# Patient Record
Sex: Male | Born: 1975 | Race: Black or African American | Hispanic: No | Smoking: Never smoker
Health system: Southern US, Community
[De-identification: ages and names within clinical notes are randomized; demographics above are authoritative.]

## PROBLEM LIST (undated history)

## (undated) DIAGNOSIS — Z21 Asymptomatic human immunodeficiency virus [HIV] infection status: Secondary | ICD-10-CM

## (undated) DIAGNOSIS — I10 Essential (primary) hypertension: Secondary | ICD-10-CM

## (undated) DIAGNOSIS — E0590 Thyrotoxicosis, unspecified without thyrotoxic crisis or storm: Secondary | ICD-10-CM

## (undated) DIAGNOSIS — B20 Human immunodeficiency virus [HIV] disease: Secondary | ICD-10-CM

---

## 2008-07-31 ENCOUNTER — Emergency Department (HOSPITAL_COMMUNITY): Admission: EM | Admit: 2008-07-31 | Discharge: 2008-07-31 | Payer: Self-pay | Admitting: Emergency Medicine

## 2008-12-30 ENCOUNTER — Encounter: Payer: Self-pay | Admitting: Emergency Medicine

## 2008-12-31 ENCOUNTER — Ambulatory Visit: Payer: Self-pay | Admitting: Cardiovascular Disease

## 2008-12-31 ENCOUNTER — Observation Stay (HOSPITAL_COMMUNITY): Admission: EM | Admit: 2008-12-31 | Discharge: 2008-12-31 | Payer: Self-pay | Admitting: Internal Medicine

## 2008-12-31 ENCOUNTER — Encounter (INDEPENDENT_AMBULATORY_CARE_PROVIDER_SITE_OTHER): Payer: Self-pay | Admitting: Internal Medicine

## 2009-01-13 DIAGNOSIS — R0789 Other chest pain: Secondary | ICD-10-CM | POA: Insufficient documentation

## 2009-01-13 DIAGNOSIS — I10 Essential (primary) hypertension: Secondary | ICD-10-CM | POA: Insufficient documentation

## 2009-01-13 DIAGNOSIS — E0590 Thyrotoxicosis, unspecified without thyrotoxic crisis or storm: Secondary | ICD-10-CM | POA: Insufficient documentation

## 2009-01-18 ENCOUNTER — Ambulatory Visit: Payer: Self-pay | Admitting: Cardiovascular Disease

## 2009-05-05 ENCOUNTER — Emergency Department (HOSPITAL_COMMUNITY): Admission: EM | Admit: 2009-05-05 | Discharge: 2009-05-05 | Payer: Self-pay | Admitting: Emergency Medicine

## 2010-05-09 LAB — DIFFERENTIAL
Basophils Absolute: 0 10*3/uL (ref 0.0–0.1)
Basophils Relative: 0 % (ref 0–1)
Eosinophils Absolute: 0 10*3/uL (ref 0.0–0.7)
Lymphocytes Relative: 14 % (ref 12–46)
Monocytes Absolute: 1 10*3/uL (ref 0.1–1.0)
Monocytes Relative: 19 % — ABNORMAL HIGH (ref 3–12)
Neutro Abs: 3.6 10*3/uL (ref 1.7–7.7)
Neutrophils Relative %: 66 % (ref 43–77)

## 2010-05-09 LAB — CBC
HCT: 37.5 % — ABNORMAL LOW (ref 39.0–52.0)
RBC: 4.83 MIL/uL (ref 4.22–5.81)

## 2010-05-18 LAB — CARDIAC PANEL(CRET KIN+CKTOT+MB+TROPI)
CK, MB: 0.6 ng/mL (ref 0.3–4.0)
Relative Index: INVALID (ref 0.0–2.5)
Total CK: 52 U/L (ref 7–232)

## 2010-05-18 LAB — CBC
MCHC: 31.5 g/dL (ref 30.0–36.0)
Platelets: 170 10*3/uL (ref 150–400)
RBC: 4.66 MIL/uL (ref 4.22–5.81)

## 2010-05-18 LAB — POCT I-STAT, CHEM 8
BUN: 5 mg/dL — ABNORMAL LOW (ref 6–23)
Calcium, Ion: 1.16 mmol/L (ref 1.12–1.32)
Creatinine, Ser: 0.7 mg/dL (ref 0.4–1.5)
Glucose, Bld: 103 mg/dL — ABNORMAL HIGH (ref 70–99)
HCT: 39 % (ref 39.0–52.0)
Hemoglobin: 13.3 g/dL (ref 13.0–17.0)
Potassium: 3.7 mEq/L (ref 3.5–5.1)
Sodium: 144 mEq/L (ref 135–145)

## 2010-05-18 LAB — CK TOTAL AND CKMB (NOT AT ARMC): Relative Index: INVALID (ref 0.0–2.5)

## 2010-05-18 LAB — PROTIME-INR: Prothrombin Time: 13.7 seconds (ref 11.6–15.2)

## 2010-05-18 LAB — RAPID URINE DRUG SCREEN, HOSP PERFORMED
Amphetamines: NOT DETECTED
Benzodiazepines: NOT DETECTED
Opiates: NOT DETECTED
Tetrahydrocannabinol: NOT DETECTED

## 2010-05-18 LAB — T4, FREE: Free T4: 4.12 ng/dL — ABNORMAL HIGH (ref 0.80–1.80)

## 2010-05-18 LAB — LIPID PANEL
HDL: 44 mg/dL (ref >39)
VLDL: 8 mg/dL (ref 0–40)

## 2010-05-18 LAB — BASIC METABOLIC PANEL
CO2: 25 mEq/L (ref 19–32)
Creatinine, Ser: 0.57 mg/dL (ref 0.4–1.5)
GFR calc Af Amer: >60 mL/min (ref >60)
GFR calc non Af Amer: >60 mL/min (ref >60)
Glucose, Bld: 89 mg/dL (ref 70–99)
Sodium: 141 mEq/L (ref 135–145)

## 2010-05-18 LAB — APTT: aPTT: 32 seconds (ref 24–37)

## 2011-05-10 ENCOUNTER — Encounter (HOSPITAL_COMMUNITY): Payer: Self-pay | Admitting: Emergency Medicine

## 2011-05-10 ENCOUNTER — Other Ambulatory Visit (HOSPITAL_COMMUNITY): Payer: Self-pay | Admitting: Pharmacy Technician

## 2011-05-10 ENCOUNTER — Emergency Department (HOSPITAL_COMMUNITY): Payer: Self-pay

## 2011-05-10 ENCOUNTER — Other Ambulatory Visit: Payer: Self-pay

## 2011-05-10 ENCOUNTER — Emergency Department (HOSPITAL_COMMUNITY)
Admission: EM | Admit: 2011-05-10 | Discharge: 2011-05-11 | Disposition: A | Discharge status: Home / Self Care | Payer: Self-pay | Attending: Emergency Medicine | Admitting: Emergency Medicine

## 2011-05-10 DIAGNOSIS — Z79899 Other long term (current) drug therapy: Secondary | ICD-10-CM | POA: Insufficient documentation

## 2011-05-10 DIAGNOSIS — R05 Cough: Secondary | ICD-10-CM | POA: Insufficient documentation

## 2011-05-10 DIAGNOSIS — Z21 Asymptomatic human immunodeficiency virus [HIV] infection status: Secondary | ICD-10-CM | POA: Insufficient documentation

## 2011-05-10 DIAGNOSIS — J40 Bronchitis, not specified as acute or chronic: Secondary | ICD-10-CM | POA: Insufficient documentation

## 2011-05-10 DIAGNOSIS — R059 Cough, unspecified: Secondary | ICD-10-CM | POA: Insufficient documentation

## 2011-05-10 DIAGNOSIS — R0602 Shortness of breath: Secondary | ICD-10-CM | POA: Insufficient documentation

## 2011-05-10 DIAGNOSIS — R079 Chest pain, unspecified: Secondary | ICD-10-CM | POA: Insufficient documentation

## 2011-05-10 HISTORY — DX: Asymptomatic human immunodeficiency virus (hiv) infection status: Z21

## 2011-05-10 HISTORY — DX: Essential (primary) hypertension: I10

## 2011-05-10 HISTORY — DX: Thyrotoxicosis, unspecified without thyrotoxic crisis or storm: E05.90

## 2011-05-10 HISTORY — DX: Human immunodeficiency virus (HIV) disease: B20

## 2011-05-10 LAB — POCT I-STAT TROPONIN I: Troponin i, poc: 0 ng/mL (ref 0.00–0.08)

## 2011-05-10 MED ORDER — ALBUTEROL SULFATE (5 MG/ML) 0.5% IN NEBU
5.0000 mg | INHALATION_SOLUTION | Freq: Once | RESPIRATORY_TRACT | Status: AC
  Administered 2011-05-10: 5 mg via RESPIRATORY_TRACT
  Filled 2011-05-10: qty 1

## 2011-05-10 MED ORDER — SODIUM CHLORIDE 0.9 % IV BOLUS (SEPSIS)
1000.0000 mL | Freq: Once | INTRAVENOUS | Status: AC
  Administered 2011-05-10: 1000 mL via INTRAVENOUS

## 2011-05-10 NOTE — ED Notes (Signed)
Pt states he has been having chest pain for the past month or so  Pt states the pain is worse with breathing

## 2011-05-10 NOTE — ED Provider Notes (Signed)
History     CSN: [id number]  Arrival date & time 05/10/11  2144   First MD Initiated Contact with Patient 05/10/11 2240      Chief Complaint  Patient presents with  . Chest Pain    (Consider location/radiation/quality/duration/timing/severity/associated sxs/prior treatment) HPI Comments: Patient with HIV comes with a month history of substernal chest pain - states the pain is the same all the time though worsening with deep inspiration but particularly with cough - states that he is having productive cough with yellow sputum, like the last time he had pneumonia - states that he has been off his HIV medications for the past year because he could not afford to get them.  Since then, he has moved from [place] to here and is searching for a new MD so that he can get the medication.  Before that time he reports that his CD4 count was still low at 300 and his viral load was high at 40,000.  He states no fever or chills, but report night sweats and weight loss.  Denies hemoptysis, radiation of the pain, back pain, nausea, vomiting or diarrhea.  Patient is a 36 y.o. male presenting with chest pain. The history is provided by the patient. No language interpreter was used.  Chest Pain The chest pain began more  than 1 month ago. Chest pain occurs constantly. The chest pain is worsening. The pain is associated with breathing and coughing. At its most intense, the pain is at 6/10. The pain is currently at 6/10. The severity of the pain is moderate. The quality of the pain is described as dull. The pain does not radiate. Chest pain is worsened by deep breathing. Primary symptoms include shortness of breath and cough. Pertinent negatives for primary symptoms include no fever, no fatigue, no syncope, no wheezing, no palpitations, no abdominal pain, no nausea, no vomiting, no dizziness and no altered mental status.  Pertinent negatives for associated symptoms include no claudication, no diaphoresis, no lower  extremity edema, no near-syncope, no numbness, no orthopnea, no paroxysmal nocturnal dyspnea and no weakness. He tried nothing for the symptoms.  His past medical history is significant for hypertension.     Past Medical History  Diagnosis Date  . Hyperthyroidism   . HIV (human immunodeficiency virus infection)   . Hypertension     History reviewed. No pertinent past surgical history.  Family History  Problem Relation Age of Onset  . Hypertension Other   . Cancer Other     History  Substance Use Topics  . Smoking status: Never Smoker   . Smokeless tobacco: Not on file  . Alcohol Use: Yes     moderate       Review of Systems  Constitutional: Negative for fever, diaphoresis and fatigue.  Respiratory: Positive for cough and shortness of breath. Negative for wheezing.   Cardiovascular: Positive for chest pain. Negative for palpitations, orthopnea, claudication, syncope and near-syncope.  Gastrointestinal: Negative for nausea, vomiting and abdominal pain.  Neurological: Negative for dizziness, weakness and numbness.  Psychiatric/Behavioral: Negative for altered mental status.  All other systems reviewed and are negative.    Allergies  Sulfamethoxazole w/trimethoprim  Home Medications   Current Outpatient Rx  Name Route Sig Dispense Refill  . ATAZANAVIR SULFATE 150 MG PO CAPS Oral Take 150 mg by mouth daily with breakfast.    . EMTRICITABINE-TENOFOVIR 200-300 MG PO TABS Oral Take 1 tablet by mouth daily.    Marland Kitchen METOPROLOL TARTRATE 25 MG PO TABS Oral  Take 25 mg by mouth 2 (two) times daily.    . ADULT MULTIVITAMIN W/MINERALS CH Oral Take 1 tablet by mouth daily.    Marland Kitchen RITONAVIR 100 MG PO CAPS Oral Take 200 mg by mouth 2 (two) times daily.      BP 149/96  Pulse 68  Temp(Src) 98.5 F (36.9 C) (Oral)  Resp 17  SpO2 100%  Physical Exam  Nursing note and vitals reviewed. Constitutional: He is oriented to person, place, and time. He appears well-developed and  well-nourished. No distress.  HENT:  Head: Normocephalic and atraumatic.  Right Ear: External ear normal.  Left Ear: External ear normal.  Nose: Nose normal.  Mouth/Throat: Oropharynx is clear and moist. No oropharyngeal exudate.  Eyes: Conjunctivae are normal. Pupils are equal, round, and reactive to light. No scleral icterus.  Neck: Normal range of motion. Neck supple.  Cardiovascular: Normal rate, regular rhythm, normal heart sounds and intact distal pulses.  Exam reveals no gallop and no friction rub.   No murmur heard. Pulmonary/Chest: Effort normal and breath sounds normal. No respiratory distress. He has no wheezes. He has no rales. He exhibits no tenderness.       Coughs with deep inspiration  Abdominal: Soft. Bowel sounds are normal. He exhibits no distension. There is no tenderness.  Musculoskeletal: Normal range of motion. He exhibits no edema and no tenderness.  Lymphadenopathy:    He has no cervical adenopathy.  Neurological: He is alert and oriented to person, place, and time. No cranial nerve deficit.  Skin: Skin is warm and dry. No rash noted. No erythema. No pallor.  Psychiatric: He has a normal mood and affect. His behavior is normal. Judgment and thought content normal.    ED Course  Procedures (including critical care time)   Labs Reviewed  POCT I-STAT TROPONIN I  CBC  DIFFERENTIAL  BASIC METABOLIC PANEL  D-DIMER, QUANTITATIVE   No results found.  Results for orders placed during the hospital encounter of 05/10/11  CBC      Component Value Range   WBC 4.3  4.0 - 10.5 (K/uL)   RBC 5.04  4.22 - 5.81 (MIL/uL)   Hemoglobin 12.0 (*) 13.0 - 17.0 (g/dL)   HCT 16.1 (*) 09.6 - 52.0 (%)   MCV 73.6 (*) 78.0 - 100.0 (fL)   MCH 23.8 (*) 26.0 - 34.0 (pg)   MCHC 32.3  30.0 - 36.0 (g/dL)   RDW 04.5  40.9 - 81.1 (%)   Platelets 128 (*) 150 - 400 (K/uL)  DIFFERENTIAL      Component Value Range   Neutrophils Relative 48  43 - 77 (%)   Lymphocytes Relative 33  12 -  46 (%)   Monocytes Relative 17 (*) 3 - 12 (%)   Eosinophils Relative 1  0 - 5 (%)   Basophils Relative 1  0 - 1 (%)   Neutro Abs 2.2  1.7 - 7.7 (K/uL)   Lymphs Abs 1.4  0.7 - 4.0 (K/uL)   Monocytes Absolute 0.7  0.1 - 1.0 (K/uL)   Eosinophils Absolute 0.0  0.0 - 0.7 (K/uL)   Basophils Absolute 0.0  0.0 - 0.1 (K/uL)   RBC Morphology ELLIPTOCYTES     WBC Morphology TOXIC GRANULATION     Smear Review LARGE PLATELETS PRESENT    POCT I-STAT TROPONIN I      Component Value Range   Troponin i, poc 0.00  0.00 - 0.08 (ng/mL)   Comment 3  D-DIMER, QUANTITATIVE      Component Value Range   D-Dimer, Quant 0.36  0.00 - 0.48 (ug/mL-FEU)   Dg Chest 2 View  05/11/2011  *RADIOLOGY REPORT*  Clinical Data: 36 year old male with chest pain.  CHEST - 2 VIEW  Comparison: 05/05/2009  Findings: The cardiomediastinal silhouette is unremarkable. The lungs are clear. There is no evidence of focal airspace disease, pulmonary edema, suspicious pulmonary nodule/mass, pleural effusion, or pneumothorax. No acute bony abnormalities are identified.  IMPRESSION: No evidence of acute cardiopulmonary disease.  Original Report Authenticated By: Rosendo Gros, M.D.    Date: 05/11/2011  Rate: 66  Rhythm: normal sinus rhythm  QRS Axis: normal  Intervals: normal  ST/T Wave abnormalities: early repolarization  Conduction Disutrbances:none  Narrative Interpretation: LVH - no real change from before - Reviewed by Dr. Nicanor Alcon  Old EKG Reviewed: changes noted    Bronchitis    MDM  Patient with HIV who has not been on his anti-virals for the past year presents with a three month history of cough and chest pain.  Though there is no consolidation on x-ray, I plan to treat the patient for likely bronchitis - he reports improvement in respiratory symptoms after the albuterol so I will give him an inhaler here and start him on azithromycin.  He now has insurance and would like to be referred to ID so I will do this as  well as referral to [facility].        Izola [place], [place] 05/11/11 [phone]

## 2011-05-10 NOTE — ED Notes (Signed)
patient has not been able to take retroviral regiment for over a year due to funds

## 2011-05-11 ENCOUNTER — Telehealth: Payer: Self-pay | Admitting: *Deleted

## 2011-05-11 LAB — DIFFERENTIAL
Basophils Relative: 1 % (ref 0–1)
Lymphocytes Relative: 33 % (ref 12–46)
Lymphs Abs: 1.4 10*3/uL (ref 0.7–4.0)
Monocytes Absolute: 0.7 10*3/uL (ref 0.1–1.0)
Neutro Abs: 2.2 10*3/uL (ref 1.7–7.7)

## 2011-05-11 LAB — BASIC METABOLIC PANEL
BUN: 15 mg/dL (ref 6–23)
CO2: 26 mEq/L (ref 19–32)
Chloride: 99 mEq/L (ref 96–112)
Creatinine, Ser: 0.94 mg/dL (ref 0.50–1.35)
GFR calc Af Amer: >90 mL/min (ref >90)
GFR calc non Af Amer: >90 mL/min (ref >90)
Glucose, Bld: 93 mg/dL (ref 70–99)
Potassium: 4 mEq/L (ref 3.5–5.1)

## 2011-05-11 LAB — D-DIMER, QUANTITATIVE: D-Dimer, Quant: 0.36 ug/mL-FEU (ref 0.00–0.48)

## 2011-05-11 LAB — CBC
MCHC: 32.3 g/dL (ref 30.0–36.0)
RBC: 5.04 MIL/uL (ref 4.22–5.81)
WBC: 4.3 10*3/uL (ref 4.0–10.5)

## 2011-05-11 MED ORDER — AZITHROMYCIN 250 MG PO TABS
500.0000 mg | ORAL_TABLET | Freq: Once | ORAL | Status: AC
  Administered 2011-05-11: 500 mg via ORAL
  Filled 2011-05-11: qty 2

## 2011-05-11 MED ORDER — HYDROCOD POLST-CHLORPHEN POLST 10-8 MG/5ML PO LQCR: 5.0000 mL | Freq: Two times a day (BID) | ORAL | Status: DC

## 2011-05-11 MED ORDER — ALBUTEROL SULFATE HFA 108 (90 BASE) MCG/ACT IN AERS
2.0000 | INHALATION_SPRAY | Freq: Once | RESPIRATORY_TRACT | Status: AC
  Administered 2011-05-11: 2 via RESPIRATORY_TRACT
  Filled 2011-05-11: qty 6.7

## 2011-05-11 MED ORDER — AZITHROMYCIN 250 MG PO TABS: ORAL_TABLET | ORAL | Status: AC

## 2011-05-11 NOTE — ED Provider Notes (Signed)
Medical screening examination/treatment/procedure(s) were performed by non-physician practitioner and as supervising physician I was immediately available for consultation/collaboration.  Egan Sahlin K Sherryn Pollino-Rasch, MD 05/11/11 0301 

## 2011-05-11 NOTE — Telephone Encounter (Signed)
Patient went to ED yesterday, he is HIV+ and has been out of care for one year. He relocated from [place], [place].  Was told at ED to call this office for appointment. Patient will need an intake and records have to be obtained from previous clinic. Patient left message with our new patient coordinator Tomasita Morrow, and she will return patient's call. Wendall Mola CMA

## 2011-05-11 NOTE — Discharge Instructions (Signed)

## 2011-08-17 ENCOUNTER — Encounter (HOSPITAL_COMMUNITY): Payer: Self-pay | Admitting: *Deleted

## 2011-08-17 ENCOUNTER — Emergency Department (HOSPITAL_COMMUNITY)
Admission: EM | Admit: 2011-08-17 | Discharge: 2011-08-17 | Disposition: A | Discharge status: Home / Self Care | Payer: Self-pay | Attending: Emergency Medicine | Admitting: Emergency Medicine

## 2011-08-17 DIAGNOSIS — Z21 Asymptomatic human immunodeficiency virus [HIV] infection status: Secondary | ICD-10-CM | POA: Insufficient documentation

## 2011-08-17 DIAGNOSIS — I10 Essential (primary) hypertension: Secondary | ICD-10-CM | POA: Insufficient documentation

## 2011-08-17 DIAGNOSIS — N342 Other urethritis: Secondary | ICD-10-CM

## 2011-08-17 DIAGNOSIS — R369 Urethral discharge, unspecified: Secondary | ICD-10-CM | POA: Insufficient documentation

## 2011-08-17 MED ORDER — LIDOCAINE HCL 1 % IJ SOLN
INTRAMUSCULAR | Status: AC
  Filled 2011-08-17: qty 20

## 2011-08-17 MED ORDER — CEFTRIAXONE SODIUM 250 MG IJ SOLR
250.0000 mg | Freq: Once | INTRAMUSCULAR | Status: AC
  Administered 2011-08-17: 250 mg via INTRAMUSCULAR
  Filled 2011-08-17 (×2): qty 250

## 2011-08-17 MED ORDER — AZITHROMYCIN 250 MG PO TABS
1000.0000 mg | ORAL_TABLET | Freq: Once | ORAL | Status: AC
  Administered 2011-08-17: 1000 mg via ORAL
  Filled 2011-08-17: qty 4

## 2011-08-17 NOTE — ED Provider Notes (Signed)
History     CSN: [id number]  Arrival date & time 08/17/11  2121   First MD Initiated Contact with Patient 08/17/11 2145      Chief Complaint  Patient presents with  . Abdominal Pain    (Consider location/radiation/quality/duration/timing/severity/associated sxs/prior treatment) HPI Patient presents emergency Dept. the 10 day history of penile discharge.  He states that he has 2 kn in his groin on each side that are painful at times.  The patient denies, abdominal pain, nausea, vomiting, or diarrhea.  Patient states he is not having pain with urination.  The patient states he is HIV positive as well. Past Medical History  Diagnosis Date  . Hyperthyroidism   . HIV (human immunodeficiency virus infection)   . Hypertension     History reviewed. No pertinent past surgical history.  Family History  Problem Relation Age of Onset  . Hypertension Other   . Cancer Other     History  Substance Use Topics  . Smoking status: Never Smoker   . Smokeless tobacco: Not on file  . Alcohol Use: Yes     moderate       Review of Systems All other systems negative except as documented in the HPI. All pertinent positives and negatives as reviewed in the HPI.  Allergies  Sulfamethoxazole w-trimethoprim  Home Medications   Current Outpatient Rx  Name Route Sig Dispense Refill  . ATAZANAVIR SULFATE 300 MG PO CAPS Oral Take 300 mg by mouth daily with breakfast.    . EMTRICITABINE-TENOFOVIR 200-300 MG PO TABS Oral Take 1 tablet by mouth daily.    Marland Kitchen METHIMAZOLE 5 MG PO TABS Oral Take 5 mg by mouth 3 (three) times daily.    Marland Kitchen RITONAVIR 100 MG PO CAPS Oral Take 100 mg by mouth daily.       BP 119/87  Pulse 107  Temp 99.2 F (37.3 C) (Oral)  Resp 16  SpO2 98%  Physical Exam  Nursing note and vitals reviewed. Constitutional: He appears well-developed and well-nourished. No distress.  Cardiovascular: Normal rate and regular rhythm.   Pulmonary/Chest: Effort normal and breath sounds  normal.  Genitourinary:    Discharge found.  Skin: Skin is warm and dry.    ED Course  Procedures (including critical care time)  Patient has urethral discharge and will be treated for this.  He is referred back to his primary care Dr. for further evaluation and recheck.  I advised him to followup with his doctor also, about this reddened area  MDM          Carlyle Dolly, PA-C 08/17/11 2225

## 2011-08-17 NOTE — ED Provider Notes (Signed)
Medical screening examination/treatment/procedure(s) were performed by non-physician practitioner and as supervising physician I was immediately available for consultation/collaboration.   Gwyneth Sprout, MD 08/17/11 2245

## 2011-08-17 NOTE — ED Notes (Signed)
Per pt c/o penile discharge; swelling x 10 days. Painful knots noted bilateral groin x 1 month

## 2011-08-18 LAB — RPR: RPR Ser Ql: REACTIVE — AB

## 2011-08-18 LAB — RPR TITER: RPR Titer: 1:64 {titer} — AB

## 2011-08-21 LAB — GC/CHLAMYDIA PROBE AMP, GENITAL
Chlamydia, DNA Probe: POSITIVE — AB
GC Probe Amp, Genital: POSITIVE — AB

## 2011-08-21 LAB — T.PALLIDUM AB, IGG: T pallidum Antibodies (TP-PA): 3.5 S/CO — ABNORMAL HIGH (ref <0.90)

## 2011-08-22 NOTE — ED Notes (Signed)
Results received from [facility]  (+) Gonorrhea & Chlamydia -> reated with Zithromax and Rocephin.  (+) RPR, Titer & T. Pallidum -> no tx noted.  [facility] forms attached.  Chart to [place] office for review.

## 2011-08-22 NOTE — ED Notes (Signed)
Steve Hernandez w/[facility] [employer] informed of (+) RPR, RPR Titer & T. Pallidum.

## 2011-08-25 NOTE — ED Notes (Signed)
attempt made to contact patient -no answer.

## 2011-08-25 NOTE — ED Notes (Signed)
Chart reviewed by Lorenz Coaster [facility] with order written for patient to return  for Benzathine Penicillin G 2.4 million units IMx1.

## 2011-08-28 NOTE — ED Notes (Signed)
Return call attempted as requested. No answer. Plan discussion with patient should they return call to [facility] general line of [phone], option 1.

## 2011-08-31 NOTE — ED Notes (Signed)
Unable to contact via phone letter sent to [facility] address. 

## 2011-09-18 NOTE — ED Notes (Signed)
No response from letter sent. Unable to contact patient. °

## 2012-05-22 ENCOUNTER — Encounter (HOSPITAL_COMMUNITY): Payer: Self-pay | Admitting: Emergency Medicine

## 2012-05-22 ENCOUNTER — Inpatient Hospital Stay (HOSPITAL_COMMUNITY)
Admission: EM | Admit: 2012-05-22 | Discharge: 2012-05-26 | DRG: 974 | Disposition: A | Discharge status: Home / Self Care | Payer: MEDICAID | Attending: Internal Medicine | Admitting: Internal Medicine

## 2012-05-22 ENCOUNTER — Emergency Department (HOSPITAL_COMMUNITY): Payer: Self-pay

## 2012-05-22 DIAGNOSIS — K5900 Constipation, unspecified: Secondary | ICD-10-CM | POA: Diagnosis present

## 2012-05-22 DIAGNOSIS — J189 Pneumonia, unspecified organism: Secondary | ICD-10-CM | POA: Diagnosis present

## 2012-05-22 DIAGNOSIS — F1210 Cannabis abuse, uncomplicated: Secondary | ICD-10-CM | POA: Diagnosis present

## 2012-05-22 DIAGNOSIS — E876 Hypokalemia: Secondary | ICD-10-CM | POA: Diagnosis present

## 2012-05-22 DIAGNOSIS — J9600 Acute respiratory failure, unspecified whether with hypoxia or hypercapnia: Secondary | ICD-10-CM | POA: Diagnosis present

## 2012-05-22 DIAGNOSIS — Z8619 Personal history of other infectious and parasitic diseases: Secondary | ICD-10-CM | POA: Diagnosis present

## 2012-05-22 DIAGNOSIS — Z21 Asymptomatic human immunodeficiency virus [HIV] infection status: Secondary | ICD-10-CM | POA: Diagnosis present

## 2012-05-22 DIAGNOSIS — R112 Nausea with vomiting, unspecified: Secondary | ICD-10-CM | POA: Diagnosis present

## 2012-05-22 DIAGNOSIS — D551 Anemia due to other disorders of glutathione metabolism: Secondary | ICD-10-CM | POA: Diagnosis present

## 2012-05-22 DIAGNOSIS — D75A Glucose-6-phosphate dehydrogenase (G6PD) deficiency without anemia: Secondary | ICD-10-CM | POA: Diagnosis present

## 2012-05-22 DIAGNOSIS — I10 Essential (primary) hypertension: Secondary | ICD-10-CM | POA: Diagnosis present

## 2012-05-22 DIAGNOSIS — E0590 Thyrotoxicosis, unspecified without thyrotoxic crisis or storm: Secondary | ICD-10-CM | POA: Diagnosis present

## 2012-05-22 DIAGNOSIS — E0500 Thyrotoxicosis with diffuse goiter without thyrotoxic crisis or storm: Secondary | ICD-10-CM | POA: Diagnosis present

## 2012-05-22 DIAGNOSIS — Z79899 Other long term (current) drug therapy: Secondary | ICD-10-CM

## 2012-05-22 DIAGNOSIS — B20 Human immunodeficiency virus [HIV] disease: Principal | ICD-10-CM | POA: Diagnosis present

## 2012-05-22 DIAGNOSIS — R0789 Other chest pain: Secondary | ICD-10-CM

## 2012-05-22 DIAGNOSIS — R109 Unspecified abdominal pain: Secondary | ICD-10-CM

## 2012-05-22 LAB — COMPREHENSIVE METABOLIC PANEL
ALT: 30 U/L (ref 0–53)
AST: 47 U/L — ABNORMAL HIGH (ref 0–37)
CO2: 27 mEq/L (ref 19–32)
Calcium: 9.5 mg/dL (ref 8.4–10.5)
Creatinine, Ser: 1.31 mg/dL (ref 0.50–1.35)
GFR calc Af Amer: 80 mL/min — ABNORMAL LOW (ref >90)
GFR calc non Af Amer: 69 mL/min — ABNORMAL LOW (ref >90)
Glucose, Bld: 99 mg/dL (ref 70–99)
Sodium: 134 mEq/L — ABNORMAL LOW (ref 135–145)
Total Protein: 8.9 g/dL — ABNORMAL HIGH (ref 6.0–8.3)

## 2012-05-22 LAB — CBC WITH DIFFERENTIAL/PLATELET
Basophils Absolute: 0 10*3/uL (ref 0.0–0.1)
Eosinophils Absolute: 0 10*3/uL (ref 0.0–0.7)
Eosinophils Relative: 0 % (ref 0–5)
HCT: 38.2 % — ABNORMAL LOW (ref 39.0–52.0)
Lymphocytes Relative: 22 % (ref 12–46)
MCH: 25.8 pg — ABNORMAL LOW (ref 26.0–34.0)
MCHC: 33 g/dL (ref 30.0–36.0)
MCV: 78.3 fL (ref 78.0–100.0)
Monocytes Absolute: 1.2 10*3/uL — ABNORMAL HIGH (ref 0.1–1.0)
Platelets: 220 10*3/uL (ref 150–400)
RDW: 12.9 % (ref 11.5–15.5)

## 2012-05-22 LAB — OCCULT BLOOD, POC DEVICE: Fecal Occult Bld: NEGATIVE

## 2012-05-22 LAB — URINALYSIS, MICROSCOPIC ONLY
Protein, ur: 100 mg/dL — AB
Specific Gravity, Urine: 1.033 — ABNORMAL HIGH (ref 1.005–1.030)
Urobilinogen, UA: 1 mg/dL (ref 0.0–1.0)

## 2012-05-22 MED ORDER — POTASSIUM CHLORIDE CRYS ER 20 MEQ PO TBCR
40.0000 meq | EXTENDED_RELEASE_TABLET | Freq: Once | ORAL | Status: AC
  Administered 2012-05-23: 40 meq via ORAL
  Filled 2012-05-22: qty 2

## 2012-05-22 MED ORDER — DEXTROSE 5 % IV SOLN
1.0000 g | INTRAVENOUS | Status: DC
  Administered 2012-05-23 – 2012-05-25 (×3): 1 g via INTRAVENOUS
  Filled 2012-05-22 (×3): qty 10

## 2012-05-22 MED ORDER — SODIUM CHLORIDE 0.9 % IV BOLUS (SEPSIS)
1000.0000 mL | Freq: Once | INTRAVENOUS | Status: AC
  Administered 2012-05-22: 1000 mL via INTRAVENOUS

## 2012-05-22 MED ORDER — ENOXAPARIN SODIUM 30 MG/0.3ML ~~LOC~~ SOLN
30.0000 mg | SUBCUTANEOUS | Status: DC
  Filled 2012-05-22: qty 0.3

## 2012-05-22 MED ORDER — MORPHINE SULFATE 4 MG/ML IJ SOLN
4.0000 mg | Freq: Once | INTRAMUSCULAR | Status: AC
  Administered 2012-05-22: 4 mg via INTRAVENOUS
  Filled 2012-05-22: qty 1

## 2012-05-22 MED ORDER — ATAZANAVIR SULFATE 150 MG PO CAPS
300.0000 mg | ORAL_CAPSULE | Freq: Every day | ORAL | Status: DC
  Administered 2012-05-23 – 2012-05-26 (×4): 300 mg via ORAL
  Filled 2012-05-22 (×5): qty 2

## 2012-05-22 MED ORDER — HYDROMORPHONE HCL PF 1 MG/ML IJ SOLN: 1.0000 mg | INTRAMUSCULAR | Status: AC | PRN

## 2012-05-22 MED ORDER — AZITHROMYCIN 250 MG PO TABS
500.0000 mg | ORAL_TABLET | Freq: Once | ORAL | Status: AC
  Administered 2012-05-22: 500 mg via ORAL
  Filled 2012-05-22: qty 2

## 2012-05-22 MED ORDER — METHIMAZOLE 5 MG PO TABS
5.0000 mg | ORAL_TABLET | Freq: Three times a day (TID) | ORAL | Status: DC
  Administered 2012-05-23 (×3): 5 mg via ORAL
  Filled 2012-05-22 (×9): qty 1

## 2012-05-22 MED ORDER — IOHEXOL 300 MG/ML  SOLN
50.0000 mL | Freq: Once | INTRAMUSCULAR | Status: AC | PRN
  Administered 2012-05-22: 50 mL via ORAL

## 2012-05-22 MED ORDER — DEXTROSE 5 % IV SOLN
1.0000 g | Freq: Once | INTRAVENOUS | Status: AC
  Administered 2012-05-22: 1 g via INTRAVENOUS
  Filled 2012-05-22: qty 10

## 2012-05-22 MED ORDER — DEXTROSE 5 % IV SOLN
500.0000 mg | INTRAVENOUS | Status: DC
  Administered 2012-05-23 – 2012-05-25 (×3): 500 mg via INTRAVENOUS
  Filled 2012-05-22 (×3): qty 500

## 2012-05-22 MED ORDER — RITONAVIR 100 MG PO TABS
100.0000 mg | ORAL_TABLET | Freq: Every day | ORAL | Status: DC
  Administered 2012-05-23 – 2012-05-26 (×4): 100 mg via ORAL
  Filled 2012-05-22 (×6): qty 1

## 2012-05-22 MED ORDER — ONDANSETRON HCL 4 MG/2ML IJ SOLN: 4.0000 mg | Freq: Three times a day (TID) | INTRAMUSCULAR | Status: AC | PRN

## 2012-05-22 MED ORDER — ACETAMINOPHEN 325 MG PO TABS
650.0000 mg | ORAL_TABLET | Freq: Once | ORAL | Status: AC
  Administered 2012-05-22: 650 mg via ORAL
  Filled 2012-05-22: qty 2

## 2012-05-22 MED ORDER — SODIUM CHLORIDE 0.9 % IV SOLN: INTRAVENOUS | Status: DC

## 2012-05-22 MED ORDER — IOHEXOL 300 MG/ML  SOLN
100.0000 mL | Freq: Once | INTRAMUSCULAR | Status: AC | PRN
  Administered 2012-05-22: 100 mL via INTRAVENOUS

## 2012-05-22 MED ORDER — EMTRICITABINE-TENOFOVIR DF 200-300 MG PO TABS
1.0000 | ORAL_TABLET | Freq: Every day | ORAL | Status: DC
  Administered 2012-05-23 – 2012-05-26 (×4): 1 via ORAL
  Filled 2012-05-22 (×5): qty 1

## 2012-05-22 MED ORDER — ONDANSETRON HCL 4 MG/2ML IJ SOLN
4.0000 mg | Freq: Once | INTRAMUSCULAR | Status: AC
  Administered 2012-05-22: 4 mg via INTRAVENOUS
  Filled 2012-05-22: qty 2

## 2012-05-22 MED ORDER — AZITHROMYCIN 250 MG PO TABS: 500.0000 mg | ORAL_TABLET | ORAL | Status: DC

## 2012-05-22 NOTE — ED Notes (Signed)
Pt states that he has been having NVD since Sunday when he "ate some bad chinese food".  States he has thrown up 1 time in the last 24 hours.

## 2012-05-22 NOTE — ED Provider Notes (Signed)
History     CSN: [id number]  Arrival date & time 05/22/12  1517   First MD Initiated Contact with Patient 05/22/12 1634      Chief Complaint  Patient presents with  . Nausea  . Emesis  . Diarrhea  . Cough    (Consider location/radiation/quality/duration/timing/severity/associated sxs/prior treatment) HPI Comments: Patient with history of HIV, followed by infectious disease at [facility], last CD4 count was less than 200 -- presents with complaint of abdominal pain, vomiting, diarrhea, cough for the past 2-3 days. Patient has been able to keep down water and Powerade but has had 2-3 episodes of vomiting today. Vomiting is nonbloody, nonbilious. Patient notes that he has had black stools. Patient has had subjective fever and chills. He has taken cough syrup without relief. The onset of this condition was acute. The course is constant. Aggravating factors: none. Alleviating factors: none.    Patient is a 37 y.o. male presenting with vomiting, diarrhea, and cough. The history is provided by the patient.  Emesis Associated symptoms: abdominal pain, chills and diarrhea   Associated symptoms: no headaches, no myalgias and no sore throat   Diarrhea Associated symptoms: abdominal pain, chills, fever (subjective) and vomiting   Associated symptoms: no headaches and no myalgias   Cough Associated symptoms: chills and fever (subjective)   Associated symptoms: no chest pain, no headaches, no myalgias, no rash, no rhinorrhea and no sore throat     Past Medical History  Diagnosis Date  . Hyperthyroidism   . HIV (human immunodeficiency virus infection)   . Hypertension     No past surgical history on file.  Family History  Problem Relation Age of Onset  . Hypertension Other   . Cancer Other     History  Substance Use Topics  . Smoking status: Never Smoker   . Smokeless tobacco: Not on file  . Alcohol Use: Yes     Comment: moderate       Review of Systems  Constitutional:  Positive for fever (subjective) and chills.  HENT: Negative for sore throat and rhinorrhea.   Eyes: Negative for redness.  Respiratory: Positive for cough.   Cardiovascular: Negative for chest pain.  Gastrointestinal: Positive for nausea, vomiting, abdominal pain and diarrhea. Negative for blood in stool (dark stools).  Genitourinary: Negative for dysuria.  Musculoskeletal: Negative for myalgias.  Skin: Negative for rash.  Neurological: Negative for headaches.    Allergies  Sulfamethoxazole w-trimethoprim  Home Medications   Current Outpatient Rx  Name  Route  Sig  Dispense  Refill  . atazanavir (REYATAZ) 300 MG capsule   Oral   Take 300 mg by mouth daily with breakfast.         . emtricitabine-tenofovir (TRUVADA) 200-300 MG per tablet   Oral   Take 1 tablet by mouth daily.         Marland Kitchen guaifenesin (ROBITUSSIN) 100 MG/5ML syrup   Oral   Take 200 mg by mouth 3 (three) times daily as needed for cough or congestion.         . methimazole (TAPAZOLE) 5 MG tablet   Oral   Take 5 mg by mouth 3 (three) times daily.         . ritonavir (NORVIR) 100 MG capsule   Oral   Take 100 mg by mouth daily.            BP 117/75  Pulse 68  Temp(Src) 99 F (37.2 C) (Oral)  Resp 16  SpO2 98%  Physical  Exam  Nursing note and vitals reviewed. Constitutional: He appears well-developed and well-nourished.  HENT:  Head: Normocephalic and atraumatic.  Eyes: Conjunctivae are normal. Right eye exhibits no discharge. Left eye exhibits no discharge.  Neck: Normal range of motion. Neck supple.  Cardiovascular: Normal rate, regular rhythm and normal heart sounds.   Pulmonary/Chest: Effort normal and breath sounds normal.  Abdominal: Soft. He exhibits no distension. There is tenderness (generalized) in the right upper quadrant, epigastric area and left upper quadrant. There is no rigidity, no rebound, no guarding and negative Murphy's sign.  Neurological: He is alert.  Skin: Skin is warm  and dry.  Psychiatric: He has a normal mood and affect.    ED Course  Procedures (including critical care time)  Labs Reviewed  CBC WITH DIFFERENTIAL - Abnormal; Notable for the following:    WBC 3.7 (*)    Hemoglobin 12.6 (*)    HCT 38.2 (*)    MCH 25.8 (*)    Monocytes Relative 32 (*)    Monocytes Absolute 1.2 (*)    All other components within normal limits  COMPREHENSIVE METABOLIC PANEL - Abnormal; Notable for the following:    Sodium 134 (*)    Potassium 3.3 (*)    Chloride 95 (*)    Total Protein 8.9 (*)    AST 47 (*)    GFR calc non Af Amer 69 (*)    GFR calc Af Amer 80 (*)    All other components within normal limits  URINALYSIS, MICROSCOPIC ONLY - Abnormal; Notable for the following:    Color, Urine AMBER (*)    APPearance CLOUDY (*)    Specific Gravity, Urine 1.033 (*)    Hgb urine dipstick TRACE (*)    Protein, ur 100 (*)    Leukocytes, UA TRACE (*)    Casts HYALINE CASTS (*)    All other components within normal limits  CULTURE, BLOOD (ROUTINE X 2)  CULTURE, BLOOD (ROUTINE X 2)  CULTURE, EXPECTORATED SPUTUM-ASSESSMENT  GRAM STAIN  LIPASE, BLOOD  MAGNESIUM  T-HELPER CELLS (CD4) COUNT  HIV 1 RNA QUANT-NO REFLEX-BLD  LEGIONELLA ANTIGEN, URINE  STREP PNEUMONIAE URINARY ANTIGEN  CBC  BASIC METABOLIC PANEL  URINE RAPID DRUG SCREEN (HOSP PERFORMED)  TSH  OCCULT BLOOD, POC DEVICE   Dg Chest 2 View  05/22/2012  *RADIOLOGY REPORT*  Clinical Data: cough  CHEST - 2 VIEW  Comparison: 05/10/11  Findings: Heart size is normal.  No pleural effusion or edema.  No airspace consolidation identified.  The visualized osseous structures are unremarkable.  IMPRESSION:  1.  No acute cardiopulmonary abnormalities.   Original Report Authenticated By: Signa Kell, M.D.    Ct Abdomen Pelvis W Contrast  05/22/2012  *RADIOLOGY REPORT*  Clinical Data:  Abdominal pain, fever x3 days  CT ABDOMEN AND PELVIS WITH CONTRAST  Technique:  Multidetector CT imaging of the abdomen and pelvis  was performed following the standard protocol during bolus administration of intravenous contrast.  Contrast: OMNIPAQUE IOHEXOL 300 MG/ML  SOLN, 50mL OMNIPAQUE IOHEXOL 300 MG/ML  SOLN  Comparison: Chest x-ray obtained earlier today 05/22/2012  Findings:  Lower Chest:  Patchy ground-glass attenuation opacity in the right lower lobe concerning for an infectious/inflammatory process. Visualized heart is within normal limits for size.  No pericardial effusion.  Unremarkable distal thoracic esophagus.  Unremarkable CT appearance of the stomach, duodenum, spleen, adrenal glands and pancreas.  Sub centimeter hypoattenuating lesion in hepatic segment six is too small to accurately characterize but statistically  highly likely to represent a benign cyst or biliary hamartoma. Gallbladder is unremarkable. No intra or extrahepatic biliary ductal dilatation.  No hydronephrosis or nephrolithiasis.  Normal renal parenchymal enhancement.  Abdomen: Normal-caliber large and small bowel throughout the abdomen.  No evidence of obstruction.  Although the appendix is not definitively identified in the right lower quadrant secondary to a relative absence of intra-abdominal fat planes, there is no significant inflammatory change in the right lower quadrant to suggest acute appendicitis.  No free fluid or suspicious adenopathy.  Pelvis: Unremarkable bladder and prostate.  No free fluid.  Bones: No acute fracture or aggressive appearing lytic or blastic osseous lesion.  Vascular:  No significant atherosclerotic disease or due to vascular abnormality.  IMPRESSION:  1.  Patchy ground-glass attenuation opacity in the right lower lobe concerning for an infectious/inflammatory process such as early pneumonia.  2.  No acute abnormality in the abdomen or pelvis.   Original Report Authenticated By: Malachy Moan, M.D.      1. Community acquired pneumonia   2. Abdominal pain   3. HIV (human immunodeficiency virus infection)   4. Other  chest pain     5:03 PM Patient seen and examined. Work-up initiated. Medications ordered. Hemoccult performed with RN Raoul Pitch) as chaperone.   Vital signs reviewed and are as follows: Filed Vitals:   05/22/12 1545  BP: 117/75  Pulse: 68  Temp: 99 F (37.2 C)  Resp: 16   7:51 PM Fever to 102.46F. Abd remains tender. CT ordered. Urine is very dark, additional fluids ordered.   9:43 PM CT shows PNA. Will ask ID for reccs on PNA. Last CD4 10/2011 was 170.   Will admit.   9:51 PM Spoke with Dr. Orvan Falconer. Reccs tx for CAP, repeat viral load and CD4.   [facility] admit. Temp admit orders completed.    MDM  Admit for fever, CAP in patient with HIV/AIDS.         Renne Crigler, PA-C 05/23/12 0010

## 2012-05-22 NOTE — ED Notes (Signed)
Patient transported to CT 

## 2012-05-22 NOTE — H&P (Signed)
[facility] History and Physical  Steve Hernandez UJW:[id number] DOB: 10/19/1975 DOA: 05/22/2012  Referring physician: ED physician PCP: No primary provider on file.   Chief Complaint: Shortness of breath   HPI:  Pt is 37 yo male with HIV, last CD4# in 200, follows with ID specialist in [facility], presents with 2-3 days worsening shortness of breath, initially started with exertion and now present at rest, associated with subjective fevers, chills, cough productive of yellow sputum, nausea and non bloody vomiting. He denies chest pain except when coughing, no specific abdominal or urinary concerns, no other systemic symptoms, no recent sick contacts or exposures.   In ED, CT findings suggestive of RLL PNA, Dr. Orvan Falconer with ID recommended CAP coverage and continuing HAART.  Assessment and Plan:  Principal Problem:   Acute respiratory failure - likely secondary to CAP - sputum analysis order placed - urine legionella and strep pneumo ordered - coverage with Zithro and Rocephin Active Problems:   PNA (pneumonia), RLL - please see above - also oxygen as needed to keep oxygen sat > 92%   Nausea and vomiting - secondary to acute illness as noted above - supportive care with IVF, analgesia and antiemetics as needed    Hypokalemia - secondary to vomiting - supplement and check BMP in AM - also check Mg level   HYPERTHYROIDISM - check TSH and continue Methimazole    HIV (human immunodeficiency virus infection) - Dr. Orvan Falconer recommended continuing home regimen HAART - check CD4# and viral load   Code Status: Full Family Communication: Pt at bedside Disposition Plan: Admit to medical floor   Review of Systems:  Constitutional: Positive for fever, chills and malaise/fatigue. Negative for diaphoresis.  HENT: Negative for hearing loss, ear pain, nosebleeds, congestion, sore throat, neck pain, tinnitus and ear discharge.   Eyes: Negative for blurred vision, double vision,  photophobia, pain, discharge and redness.  Respiratory: Positive for cough, sputum production, shortness of breath, wheezing.   Cardiovascular: Negative for chest pain, palpitations, orthopnea, claudication and leg swelling.  Gastrointestinal: Positive for nausea, vomiting. Negative for heartburn, constipation, blood in stool and melena.  Genitourinary: Negative for dysuria, urgency, frequency, hematuria and flank pain.  Musculoskeletal: Negative for myalgias, back pain, joint pain and falls.  Skin: Negative for itching and rash.  Neurological: Negative for dizziness and weakness. Negative for tingling, tremors, sensory change, speech change, focal weakness, loss of consciousness and headaches.  Endo/Heme/Allergies: Negative for environmental allergies and polydipsia. Does not bruise/bleed easily.  Psychiatric/Behavioral: Negative for suicidal ideas. The patient is not nervous/anxious.      Past Medical History  Diagnosis Date  . Hyperthyroidism   . HIV (human immunodeficiency virus infection)   . Hypertension     No past surgical history on file.  Social History:  reports that he has never smoked. He does not have any smokeless tobacco history on file. He reports that  drinks alcohol. He reports that he uses illicit drugs (Marijuana).  Allergies  Allergen Reactions  . Sulfamethoxazole W-Trimethoprim Hives    Family History  Problem Relation Age of Onset  . Hypertension Other   . Cancer Other     Prior to Admission medications   Medication Sig Start Date End Date Taking? Authorizing Provider  atazanavir (REYATAZ) 300 MG capsule Take 300 mg by mouth daily with breakfast.   Yes Historical Provider, MD  emtricitabine-tenofovir (TRUVADA) 200-300 MG per tablet Take 1 tablet by mouth daily.   Yes Historical Provider, MD  guaifenesin (ROBITUSSIN) 100  MG/5ML syrup Take 200 mg by mouth 3 (three) times daily as needed for cough or congestion.   Yes Historical Provider, MD  methimazole  (TAPAZOLE) 5 MG tablet Take 5 mg by mouth 3 (three) times daily.   Yes Historical Provider, MD  ritonavir (NORVIR) 100 MG capsule Take 100 mg by mouth daily.    Yes Historical Provider, MD    Physical Exam: Filed Vitals:   05/22/12 1545 05/22/12 2133  BP: 117/75 121/67  Pulse: 68 59  Temp: 99 F (37.2 C) 100.2 F (37.9 C)  TempSrc: Oral Oral  Resp: 16 18  SpO2: 98% 96%    Physical Exam  Constitutional: Appears well-developed and well-nourished. No distress.  HENT: Normocephalic. External right and left ear normal. Oropharynx is clear and moist.  Eyes: Conjunctivae and EOM are normal. PERRLA, no scleral icterus.  Neck: Normal ROM. Neck supple. No JVD. No tracheal deviation. No thyromegaly.  CVS: RRR, S1/S2 +, no murmurs, no gallops, no carotid bruit.  Pulmonary: Effort and breath sounds normal, no stridor, rales at bases, no wheezing  Abdominal: Soft. BS +,  no distension, tenderness, rebound or guarding.  Musculoskeletal: Normal range of motion. No edema and no tenderness.  Lymphadenopathy: No lymphadenopathy noted, cervical, inguinal. Neuro: Alert. Normal reflexes, muscle tone coordination. No cranial nerve deficit. Skin: Skin is warm and dry. No rash noted. Not diaphoretic. No erythema. No pallor.  Psychiatric: Normal mood and affect. Behavior, judgment, thought content normal.   Labs on Admission:  Basic Metabolic Panel:  Recent Labs Lab 05/22/12 1604  NA 134*  K 3.3*  CL 95*  CO2 27  GLUCOSE 99  BUN 17  CREATININE 1.31  CALCIUM 9.5   Liver Function Tests:  Recent Labs Lab 05/22/12 1604  AST 47*  ALT 30  ALKPHOS 88  BILITOT 0.7  PROT 8.9*  ALBUMIN 4.0    Recent Labs Lab 05/22/12 1604  LIPASE 20   CBC:  Recent Labs Lab 05/22/12 1604  WBC 3.7*  NEUTROABS 1.7  HGB 12.6*  HCT 38.2*  MCV 78.3  PLT 220   Radiological Exams on Admission: Dg Chest 2 View 05/22/2012  1.  No acute cardiopulmonary abnormalities.    Ct Abdomen Pelvis W  Contrast 05/22/2012   1.  Patchy ground-glass attenuation opacity in the right lower lobe concerning for an infectious/inflammatory process such as early pneumonia.   2.  No acute abnormality in the abdomen or pelvis.   EKG: Normal sinus rhythm, no ST/T wave changes  Debbora Presto, MD  [facility] Pager [phone]  If 7PM-7AM, please contact night-coverage [email or web address] Password [email or web address] 05/22/2012, 10:20 PM

## 2012-05-22 NOTE — ED Notes (Signed)
Called to give report, rn will return phone call.

## 2012-05-22 NOTE — ED Notes (Signed)
Pt aware that a urine sample is needed, unable to urinate at this time.  Will continue to monitor.

## 2012-05-23 ENCOUNTER — Encounter (HOSPITAL_COMMUNITY): Payer: Self-pay | Admitting: *Deleted

## 2012-05-23 DIAGNOSIS — R112 Nausea with vomiting, unspecified: Secondary | ICD-10-CM

## 2012-05-23 DIAGNOSIS — J189 Pneumonia, unspecified organism: Secondary | ICD-10-CM

## 2012-05-23 DIAGNOSIS — J9600 Acute respiratory failure, unspecified whether with hypoxia or hypercapnia: Secondary | ICD-10-CM

## 2012-05-23 DIAGNOSIS — D75A Glucose-6-phosphate dehydrogenase (G6PD) deficiency without anemia: Secondary | ICD-10-CM | POA: Diagnosis present

## 2012-05-23 DIAGNOSIS — E0500 Thyrotoxicosis with diffuse goiter without thyrotoxic crisis or storm: Secondary | ICD-10-CM | POA: Diagnosis present

## 2012-05-23 DIAGNOSIS — Z21 Asymptomatic human immunodeficiency virus [HIV] infection status: Secondary | ICD-10-CM

## 2012-05-23 DIAGNOSIS — Z8619 Personal history of other infectious and parasitic diseases: Secondary | ICD-10-CM | POA: Diagnosis present

## 2012-05-23 LAB — BASIC METABOLIC PANEL
BUN: 15 mg/dL (ref 6–23)
Calcium: 8 mg/dL — ABNORMAL LOW (ref 8.4–10.5)
Chloride: 100 mEq/L (ref 96–112)
Creatinine, Ser: 1.26 mg/dL (ref 0.50–1.35)
GFR calc Af Amer: >90 mL/min (ref >90)
GFR calc non Af Amer: 72 mL/min — ABNORMAL LOW (ref >90)
Glucose, Bld: 87 mg/dL (ref 70–99)
Potassium: 3.3 mEq/L — ABNORMAL LOW (ref 3.5–5.1)
Sodium: 135 mEq/L (ref 135–145)

## 2012-05-23 LAB — CBC
HCT: 32.6 % — ABNORMAL LOW (ref 39.0–52.0)
Hemoglobin: 10.7 g/dL — ABNORMAL LOW (ref 13.0–17.0)
MCH: 25.8 pg — ABNORMAL LOW (ref 26.0–34.0)
MCHC: 32.8 g/dL (ref 30.0–36.0)
MCV: 78.6 fL (ref 78.0–100.0)
RDW: 12.9 % (ref 11.5–15.5)

## 2012-05-23 LAB — RAPID URINE DRUG SCREEN, HOSP PERFORMED
Barbiturates: NOT DETECTED
Cocaine: NOT DETECTED
Tetrahydrocannabinol: POSITIVE — AB

## 2012-05-23 LAB — LEGIONELLA ANTIGEN, URINE

## 2012-05-23 LAB — TSH: TSH: 3.003 u[IU]/mL (ref 0.350–4.500)

## 2012-05-23 LAB — T-HELPER CELLS (CD4) COUNT (NOT AT ARMC)
CD4 % Helper T Cell: 9 % — ABNORMAL LOW (ref 33–55)
CD4 T Cell Abs: 90 uL — ABNORMAL LOW (ref 400–2700)

## 2012-05-23 MED ORDER — ACETAMINOPHEN 325 MG PO TABS
650.0000 mg | ORAL_TABLET | ORAL | Status: DC | PRN
  Administered 2012-05-23 – 2012-05-25 (×6): 650 mg via ORAL
  Filled 2012-05-23 (×6): qty 2

## 2012-05-23 MED ORDER — SODIUM CHLORIDE 0.9 % IV BOLUS (SEPSIS)
250.0000 mL | Freq: Once | INTRAVENOUS | Status: AC
  Administered 2012-05-23: 250 mL via INTRAVENOUS

## 2012-05-23 MED ORDER — ENSURE COMPLETE PO LIQD
237.0000 mL | Freq: Three times a day (TID) | ORAL | Status: DC
  Administered 2012-05-23 – 2012-05-25 (×5): 237 mL via ORAL

## 2012-05-23 MED ORDER — ATOVAQUONE 750 MG/5ML PO SUSP
1500.0000 mg | Freq: Every day | ORAL | Status: DC
  Administered 2012-05-24 – 2012-05-26 (×3): 1500 mg via ORAL
  Filled 2012-05-23 (×5): qty 10

## 2012-05-23 MED ORDER — ENOXAPARIN SODIUM 40 MG/0.4ML ~~LOC~~ SOLN
40.0000 mg | SUBCUTANEOUS | Status: DC
  Filled 2012-05-23: qty 0.4

## 2012-05-23 MED ORDER — LORAZEPAM 2 MG/ML IJ SOLN
1.0000 mg | Freq: Once | INTRAMUSCULAR | Status: AC
  Administered 2012-05-23: 1 mg via INTRAVENOUS
  Filled 2012-05-23: qty 1

## 2012-05-23 MED ORDER — ADULT MULTIVITAMIN W/MINERALS CH
1.0000 | ORAL_TABLET | Freq: Every day | ORAL | Status: DC
  Administered 2012-05-23 – 2012-05-25 (×3): 1 via ORAL
  Filled 2012-05-23 (×4): qty 1

## 2012-05-23 MED ORDER — MENTHOL 3 MG MT LOZG
1.0000 | LOZENGE | OROMUCOSAL | Status: DC | PRN
  Administered 2012-05-23 (×2): 3 mg via ORAL
  Filled 2012-05-23: qty 9

## 2012-05-23 MED ORDER — IBUPROFEN 800 MG PO TABS
800.0000 mg | ORAL_TABLET | Freq: Once | ORAL | Status: AC
  Administered 2012-05-23: 800 mg via ORAL
  Filled 2012-05-23: qty 1

## 2012-05-23 MED ORDER — SODIUM CHLORIDE 0.9 % IV SOLN
INTRAVENOUS | Status: DC
  Administered 2012-05-23 – 2012-05-24 (×2): via INTRAVENOUS

## 2012-05-23 MED ORDER — ACETAMINOPHEN 325 MG PO TABS
ORAL_TABLET | ORAL | Status: AC
  Administered 2012-05-23: 325 mg
  Filled 2012-05-23: qty 2

## 2012-05-23 NOTE — Plan of Care (Signed)
Problem: Phase I Progression Outcomes Goal: Consider Infectious Disease Consult Outcome: Progressing MD discussed with Dr Orvan Falconer with ID.

## 2012-05-23 NOTE — Progress Notes (Signed)
PRN order for tylenol obtained. Fever this am 102.7. Tylenol given.

## 2012-05-23 NOTE — Plan of Care (Signed)
Problem: Phase I Progression Outcomes Goal: Confirm chest x-ray completed Outcome: Completed/Met Date Met:  05/23/12 Had CT scan

## 2012-05-23 NOTE — Progress Notes (Signed)
INITIAL NUTRITION ASSESSMENT  DOCUMENTATION CODES Per approved criteria  -Not Applicable   INTERVENTION: - Ensure Complete QID - Multivitamin 1 tablet PO daily - Encouraged high calorie/protein intake  - Will continue to monitor   NUTRITION DIAGNOSIS: Inadequate oral intake related to poor appetite from fever as evidenced by pt statement, <25% meal intake.   Goal: Pt to consume 100% of meals/supplements  Monitor:  Weights, labs, intake  Reason for Assessment: Nutrition risk   37 y.o. male  Admitting Dx: Acute respiratory failure  ASSESSMENT: Pt with history of HIV presents with 2-3 days worsening shortness of breath, fevers, cough, chills, and nausea with non-bloody vomiting. Pt reports last emesis was yesterday morning. Pt reports not eating/drinking anything other than Powerade and water for 2 days PTA. Pt reports before then he was eating 3-4 meals/day and drinking at least 2 Ensure/day. Pt also supplementing his diet with muscle milk after workouts. Pt reports he has been gaining weight recently, unsure how much. Pt denies any nausea today just c/o lack of appetite from fever this morning.   Height: Ht Readings from Last 1 Encounters:  05/22/12 6\' 2"  (1.88 m)    Weight: Wt Readings from Last 1 Encounters:  05/22/12 162 lb 3.2 oz (73.573 kg)    Ideal Body Weight: 190 lb  % Ideal Body Weight: 85  Wt Readings from Last 10 Encounters:  05/22/12 162 lb 3.2 oz (73.573 kg)  01/18/09 166 lb (75.297 kg)    Usual Body Weight: Pt unsure but states he has been gaining weight   BMI:  Body mass index is 20.82 kg/(m^2).  Estimated Nutritional Needs: Kcal: 2550-2900 Protein: 145-160g Fluid: 2.5-2.9L/day  Skin: Intact  Diet Order: General  EDUCATION NEEDS: -No education needs identified at this time   Intake/Output Summary (Last 24 hours) at 05/23/12 1135 Last data filed at 05/23/12 0550  Gross per 24 hour  Intake 676.67 ml  Output    300 ml  Net 376.67 ml     Last BM: 4/9  Labs:   Recent Labs Lab 05/22/12 1604 05/22/12 2253 05/23/12 0400  NA 134*  --  137  K 3.3*  --  3.7  CL 95*  --  102  CO2 27  --  25  BUN 17  --  15  CREATININE 1.31  --  1.26  CALCIUM 9.5  --  8.0*  MG  --  1.8  --   GLUCOSE 99  --  87    CBG (last 3)  No results found for this basename: GLUCAP,  in the last 72 hours  Scheduled Meds: . atazanavir  300 mg Oral Q breakfast  . azithromycin  500 mg Intravenous Q24H  . cefTRIAXone (ROCEPHIN)  IV  1 g Intravenous Q24H  . emtricitabine-tenofovir  1 tablet Oral Daily  . [START ON 05/24/2012] enoxaparin (LOVENOX) injection  40 mg Subcutaneous Q24H  . methimazole  5 mg Oral TID  . ritonavir  100 mg Oral Daily    Continuous Infusions:   Past Medical History  Diagnosis Date  . Hyperthyroidism   . HIV (human immunodeficiency virus infection)   . Hypertension     History reviewed. No pertinent past surgical history.   Levon Hedger MS, RD, LDN [phone] Pager [phone] After Hours Pager

## 2012-05-23 NOTE — Progress Notes (Signed)
Patient admitted to room 1332 from ED with PNA. Placed on protective precautions. VSS. Oriented to room and unit.

## 2012-05-23 NOTE — Progress Notes (Signed)
[facility] PROGRESS NOTE  Steve Hernandez ZOX:[id number] DOB: 1975/12/14 DOA: 05/22/2012  PCP: ID at [facility]  Brief HPI: Pt is 37 yo male with HIV, last CD4# in 200, follows with ID specialist in [facility], presents with 2-3 days worsening shortness of breath, initially started with exertion and now present at rest, associated with subjective fevers, chills, cough productive of yellow sputum, nausea and non bloody vomiting. He denied chest pain except when coughing, no specific abdominal or urinary concerns, no other systemic symptoms, no recent sick contacts or exposures. In ED, CT findings suggestive of RLL PNA, Dr. Orvan Falconer with ID recommended CAP coverage and continuing HAART.  Past medical history:  Past Medical History  Diagnosis Date  . Hyperthyroidism   . HIV (human immunodeficiency virus infection)   . Hypertension     Consultants: Phone consult with ID  Procedures: None  Antibiotics: IV Ceftriaxone and Azithromycin 4/9  Subjective: Patient continues to cough yellow sputum. But he feels better. Denies chest pain or shortness of breath. No nausea or vomiting.  Objective: Vital Signs  Filed Vitals:   05/22/12 2335 05/23/12 0545 05/23/12 1021 05/23/12 1327  BP:  121/54  111/62  Pulse: 49 64  54  Temp:  102.7 F (39.3 C) 101.9 F (38.8 C) 98.8 F (37.1 C)  TempSrc:  Oral Oral Oral  Resp:  16  16  Height:      Weight:      SpO2:  94%  98%    Intake/Output Summary (Last 24 hours) at 05/23/12 1353 Last data filed at 05/23/12 1336  Gross per 24 hour  Intake 954.67 ml  Output    700 ml  Net 254.67 ml   Filed Weights   05/22/12 2323  Weight: 73.573 kg (162 lb 3.2 oz)    Intake/Output from previous day: 04/09 0701 - 04/10 0700 In: 676.7 [P.O.:360; I.V.:316.7] Out: 300 [Urine:300]  General appearance: alert, cooperative, appears stated age and no distress Head: Normocephalic, without obvious abnormality, atraumatic Resp: Crackles bilateral bases R>L.  No wheezing. Cardio: regular rate and rhythm, S1, S2 normal, no murmur, click, rub or gallop GI: soft, non-tender; bowel sounds normal; no masses,  no organomegaly Extremities: extremities normal, atraumatic, no cyanosis or edema Neurologic: Alert and oriented x 3. No focal deficits.  Lab Results:  Basic Metabolic Panel:  Recent Labs Lab 05/22/12 1604 05/22/12 2253 05/23/12 0400  NA 134*  --  137  K 3.3*  --  3.7  CL 95*  --  102  CO2 27  --  25  GLUCOSE 99  --  87  BUN 17  --  15  CREATININE 1.31  --  1.26  CALCIUM 9.5  --  8.0*  MG  --  1.8  --    Liver Function Tests:  Recent Labs Lab 05/22/12 1604  AST 47*  ALT 30  ALKPHOS 88  BILITOT 0.7  PROT 8.9*  ALBUMIN 4.0    Recent Labs Lab 05/22/12 1604  LIPASE 20   No results found for this basename: AMMONIA,  in the last 168 hours CBC:  Recent Labs Lab 05/22/12 1604 05/23/12 0400  WBC 3.7* 4.1  NEUTROABS 1.7  --   HGB 12.6* 10.7*  HCT 38.2* 32.6*  MCV 78.3 78.6  PLT 220 151    Studies/Results: Dg Chest 2 View  05/22/2012  *RADIOLOGY REPORT*  Clinical Data: cough  CHEST - 2 VIEW  Comparison: 05/10/11  Findings: Heart size is normal.  No pleural effusion  or edema.  No airspace consolidation identified.  The visualized osseous structures are unremarkable.  IMPRESSION:  1.  No acute cardiopulmonary abnormalities.   Original Report Authenticated By: Signa Kell, M.D.    Ct Abdomen Pelvis W Contrast  05/22/2012  *RADIOLOGY REPORT*  Clinical Data:  Abdominal pain, fever x3 days  CT ABDOMEN AND PELVIS WITH CONTRAST  Technique:  Multidetector CT imaging of the abdomen and pelvis was performed following the standard protocol during bolus administration of intravenous contrast.  Contrast: OMNIPAQUE IOHEXOL 300 MG/ML  SOLN, 50mL OMNIPAQUE IOHEXOL 300 MG/ML  SOLN  Comparison: Chest x-ray obtained earlier today 05/22/2012  Findings:  Lower Chest:  Patchy ground-glass attenuation opacity in the right lower lobe  concerning for an infectious/inflammatory process. Visualized heart is within normal limits for size.  No pericardial effusion.  Unremarkable distal thoracic esophagus.  Unremarkable CT appearance of the stomach, duodenum, spleen, adrenal glands and pancreas.  Sub centimeter hypoattenuating lesion in hepatic segment six is too small to accurately characterize but statistically highly likely to represent a benign cyst or biliary hamartoma. Gallbladder is unremarkable. No intra or extrahepatic biliary ductal dilatation.  No hydronephrosis or nephrolithiasis.  Normal renal parenchymal enhancement.  Abdomen: Normal-caliber large and small bowel throughout the abdomen.  No evidence of obstruction.  Although the appendix is not definitively identified in the right lower quadrant secondary to a relative absence of intra-abdominal fat planes, there is no significant inflammatory change in the right lower quadrant to suggest acute appendicitis.  No free fluid or suspicious adenopathy.  Pelvis: Unremarkable bladder and prostate.  No free fluid.  Bones: No acute fracture or aggressive appearing lytic or blastic osseous lesion.  Vascular:  No significant atherosclerotic disease or due to vascular abnormality.  IMPRESSION:  1.  Patchy ground-glass attenuation opacity in the right lower lobe concerning for an infectious/inflammatory process such as early pneumonia.  2.  No acute abnormality in the abdomen or pelvis.   Original Report Authenticated By: Malachy Moan, M.D.     Medications:  Scheduled: . atazanavir  300 mg Oral Q breakfast  . azithromycin  500 mg Intravenous Q24H  . cefTRIAXone (ROCEPHIN)  IV  1 g Intravenous Q24H  . emtricitabine-tenofovir  1 tablet Oral Daily  . [START ON 05/24/2012] enoxaparin (LOVENOX) injection  40 mg Subcutaneous Q24H  . feeding supplement  237 mL Oral TID PC & HS  . methimazole  5 mg Oral TID  . multivitamin with minerals  1 tablet Oral Daily  . ritonavir  100 mg Oral Daily    Continuous:  ZOX:[id number], menthol-cetylpyridinium  Assessment/Plan:  Principal Problem:   Acute respiratory failure Active Problems:   HYPERTHYROIDISM   PNA (pneumonia)   Nausea and vomiting   Hypokalemia   HIV (human immunodeficiency virus infection)    Acute respiratory failure  Was likely secondary to CAP. Now better. Saturating well on RA.  Community Acquired PNA (pneumonia), RLL  Continue Iv abx. Patient feels better. Sputum culture is pending. Urine legionella and strep pneumo negative.    Nausea and vomiting  Likely secondary to acute illness as noted above. Now resolved.    Hypokalemia  Repleted. Mg level is normal.   History of HYPERTHYROIDISM  Continue Methimazole. TSH is 3.0.  HIV (human immunodeficiency virus infection)  Dr. Orvan Falconer recommended continuing home regimen HAART. Check CD4# and viral load  DVT Prophylaxis: Enoxaparin Code Status: Full  Family Communication: Pt at bedside  Disposition Plan: Likely return home when improved.    LOS:  1 day   [facility]  [facility] Pager [phone] 05/23/2012, 1:53 PM  If 8PM-8AM, please contact night-coverage at [email or web address], password [facility]

## 2012-05-23 NOTE — Consult Note (Signed)
[facility]    Date of Admission:  05/22/2012    Day 1 ceftriaxone        Day 1 azithromycin       Reason for Consult: HIV infection and right lower lobe pneumonia    Referring Physician: Dr. Danie Binder  Principal Problem:   PNA (pneumonia) Active Problems:   HIV (human immunodeficiency virus infection)   Nausea and vomiting   Hypokalemia   H/O syphilis   H/O gonorrhea   Graves disease   G6PD deficiency   . atazanavir  300 mg Oral Q breakfast  . azithromycin  500 mg Intravenous Q24H  . cefTRIAXone (ROCEPHIN)  IV  1 g Intravenous Q24H  . emtricitabine-tenofovir  1 tablet Oral Daily  . [START ON 05/24/2012] enoxaparin (LOVENOX) injection  40 mg Subcutaneous Q24H  . feeding supplement  237 mL Oral TID PC & HS  . methimazole  5 mg Oral TID  . multivitamin with minerals  1 tablet Oral Daily  . ritonavir  100 mg Oral Daily    Recommendations: 1. Continue ceftriaxone and azithromycin 2. Continue current antiretroviral regimen 3. Restart atovaquone for pneumocystis prophylaxis  4. Check HIV viral load results 5. repeat RPR and gonorrhea assay  Assessment: The findings on his chest CT scan are rather subtle but I suspect he does have right lower lobe community-acquired pneumonia with typical bacterial pathogens. He states that he has not missed any of his atovaquone. This and his clinical presentation suggests that pneumocystis pneumonia is relatively unlikely. I will continue his current and about regimen and followup tomorrow.    HPI: Steve Hernandez is a 37 y.o. male  who was diagnosed with HIV infection in 2001. He moved here from [place] last year and after being off of antiretroviral therapy for a time reentered care at [facility] with Dr. Farrell Ours. He was off his medications until early January. At that time his CD4 count was 80 and his HIV viral load was 77,437. That point he restarted Truvada,  Reyataz and Norvir along with atovaquone prophylaxis. He denies missing doses since that time. He was also treated for early syphilis and gonorrhea. He was feeling better until 4 days ago when he began to have high fevers, nausea, vomiting, diarrhea, cough productive of thin yellow sputum and mild shortness of breath. His roommate had a similar illness recently. He came to the emergency department yesterday he was noted to have a temperature of a 103. His chest x-ray was relatively unremarkable but when he had an abdominal CT scan faint infiltrates were seen in the right lower lobe suggesting community-acquired pneumonia. He is feeling much better today.   Review of Systems: Pertinent items are noted in HPI.  Past Medical History  Diagnosis Date  . Hyperthyroidism   . HIV (human immunodeficiency virus infection)   . Hypertension     History  Substance Use Topics  . Smoking status: Never Smoker   . Smokeless tobacco: Not on file  . Alcohol Use: Yes     Comment: rarely    Family History  Problem Relation Age of Onset  . Hypertension Other   . Cancer Other   . Diabetes Mother    Allergies  Allergen Reactions  . Sulfamethoxazole W-Trimethoprim Hives    OBJECTIVE: Blood pressure 111/62, pulse 54, temperature 99.4 F (37.4 C), temperature source Oral, resp. rate 16, height 6\' 2"  (1.88 m),  weight 73.573 kg (162 lb 3.2 oz), SpO2 98.00%. General:  he is alert and comfortable watching television Skin:  no rash Lungs:  faint crackles in the right lung base posteriorly Cor:  slow regular S1 and S2 with no murmurs Abdomen:  soft and nontender  CD4 T Cell Abs (cmm)  Date Value  05/22/2012 90*   Microbiology: No results found for this or any previous visit (from the past 240 hour(s)).  Cliffton Asters, MD [facility] [phone] pager   [phone] cell 05/23/2012, 6:35 PM

## 2012-05-23 NOTE — ED Provider Notes (Signed)
Medical screening examination/treatment/procedure(s) were performed by non-physician practitioner and as supervising physician I was immediately available for consultation/collaboration.   Chelsey Redondo L Modean Mccullum, MD 05/23/12 1307 

## 2012-05-23 NOTE — Progress Notes (Signed)
Pt requested Tylenol, "fever starting back".  Tmep = 99.4.  Admin 650 mg PO Tylenol.

## 2012-05-24 DIAGNOSIS — E0590 Thyrotoxicosis, unspecified without thyrotoxic crisis or storm: Secondary | ICD-10-CM | POA: Diagnosis present

## 2012-05-24 LAB — HIV-1 RNA QUANT-NO REFLEX-BLD
HIV 1 RNA Quant: <20 copies/mL (ref <20)
HIV-1 RNA Quant, Log: <1.3 {Log} (ref <1.30)

## 2012-05-24 LAB — GC/CHLAMYDIA PROBE AMP
CT Probe RNA: NEGATIVE
GC Probe RNA: NEGATIVE

## 2012-05-24 LAB — EXPECTORATED SPUTUM ASSESSMENT W GRAM STAIN, RFLX TO RESP C

## 2012-05-24 LAB — CBC
HCT: 35.5 % — ABNORMAL LOW (ref 39.0–52.0)
Hemoglobin: 11.5 g/dL — ABNORMAL LOW (ref 13.0–17.0)
RBC: 4.5 MIL/uL (ref 4.22–5.81)
WBC: 2.1 10*3/uL — ABNORMAL LOW (ref 4.0–10.5)

## 2012-05-24 MED ORDER — SODIUM CHLORIDE 0.9 % IV SOLN: INTRAVENOUS | Status: DC

## 2012-05-24 MED ORDER — ONDANSETRON HCL 4 MG/2ML IJ SOLN
4.0000 mg | Freq: Four times a day (QID) | INTRAMUSCULAR | Status: DC | PRN
  Administered 2012-05-24 – 2012-05-25 (×3): 4 mg via INTRAVENOUS
  Filled 2012-05-24 (×3): qty 2

## 2012-05-24 MED ORDER — SODIUM CHLORIDE 0.9 % IV BOLUS (SEPSIS)
250.0000 mL | Freq: Once | INTRAVENOUS | Status: AC
  Administered 2012-05-24: 250 mL via INTRAVENOUS

## 2012-05-24 MED ORDER — GUAIFENESIN 100 MG/5ML PO SOLN: 5.0000 mL | ORAL | Status: DC | PRN

## 2012-05-24 MED ORDER — METHIMAZOLE 10 MG PO TABS
10.0000 mg | ORAL_TABLET | Freq: Every day | ORAL | Status: DC
  Administered 2012-05-24 – 2012-05-26 (×3): 10 mg via ORAL
  Filled 2012-05-24 (×3): qty 1

## 2012-05-24 NOTE — Progress Notes (Signed)
[facility] PROGRESS NOTE  Steve Hernandez ZOX:[id number] DOB: 02/05/1976 DOA: 05/22/2012  PCP: ID at [facility]  Brief HPI: Pt is 37 yo male with HIV, last CD4# in 200, follows with ID specialist in [facility], presents with 2-3 days worsening shortness of breath, initially started with exertion and now present at rest, associated with subjective fevers, chills, cough productive of yellow sputum, nausea and non bloody vomiting. He denied chest pain except when coughing, no specific abdominal or urinary concerns, no other systemic symptoms, no recent sick contacts or exposures. In ED, CT findings suggestive of RLL PNA, Dr. Orvan Falconer with ID recommended CAP coverage and continuing HAART.  Past medical history:  Past Medical History  Diagnosis Date  . Hyperthyroidism   . HIV (human immunodeficiency virus infection)   . Hypertension     Consultants: ID  Procedures: None  Antibiotics: IV Ceftriaxone and Azithromycin 4/9  Subjective: Patient feels better. Continues to cough yellow sputum. Had fever overnight. Denies chest pain or shortness of breath. Feels better after having a BM.  Objective: Vital Signs  Filed Vitals:   05/23/12 2105 05/23/12 2232 05/24/12 0457 05/24/12 0608  BP: 98/42 110/58 95/50 108/53  Pulse:   47 50  Temp:  99.8 F (37.7 C) 98.5 F (36.9 C)   TempSrc:   Oral   Resp:   16   Height:      Weight:      SpO2:   99%     Intake/Output Summary (Last 24 hours) at 05/24/12 1259 Last data filed at 05/24/12 8119  Gross per 24 hour  Intake  995.5 ml  Output   1525 ml  Net -529.5 ml   Filed Weights   05/22/12 2323  Weight: 73.573 kg (162 lb 3.2 oz)    Intake/Output from previous day: 04/10 0701 - 04/11 0700 In: 995.5 [I.V.:995.5] Out: 1525 [Urine:1525]  General appearance: alert, cooperative, appears stated age and no distress Head: Normocephalic, without obvious abnormality, atraumatic Resp: Crackles bilateral bases R>L. No wheezing. Cardio: regular  rate and rhythm, S1, S2 normal, no murmur, click, rub or gallop GI: soft, non-tender; bowel sounds normal; no masses,  no organomegaly Extremities: extremities normal, atraumatic, no cyanosis or edema Neurologic: Alert and oriented x 3. No focal deficits.  Lab Results:  Basic Metabolic Panel:  Recent Labs Lab 05/22/12 1604 05/22/12 2253 05/23/12 0400 05/23/12 1422  NA 134*  --  137 135  K 3.3*  --  3.7 3.3*  CL 95*  --  102 100  CO2 27  --  25 26  GLUCOSE 99  --  87 107*  BUN 17  --  15 12  CREATININE 1.31  --  1.26 1.11  CALCIUM 9.5  --  8.0* 8.4  MG  --  1.8  --   --    Liver Function Tests:  Recent Labs Lab 05/22/12 1604  AST 47*  ALT 30  ALKPHOS 88  BILITOT 0.7  PROT 8.9*  ALBUMIN 4.0    Recent Labs Lab 05/22/12 1604  LIPASE 20   No results found for this basename: AMMONIA,  in the last 168 hours CBC:  Recent Labs Lab 05/22/12 1604 05/23/12 0400 05/24/12 0352  WBC 3.7* 4.1 2.1*  NEUTROABS 1.7  --   --   HGB 12.6* 10.7* 11.5*  HCT 38.2* 32.6* 35.5*  MCV 78.3 78.6 78.9  PLT 220 151 165    Studies/Results: Dg Chest 2 View  05/22/2012  *RADIOLOGY REPORT*  Clinical Data:  cough  CHEST - 2 VIEW  Comparison: 05/10/11  Findings: Heart size is normal.  No pleural effusion or edema.  No airspace consolidation identified.  The visualized osseous structures are unremarkable.  IMPRESSION:  1.  No acute cardiopulmonary abnormalities.   Original Report Authenticated By: Signa Kell, M.D.    Ct Abdomen Pelvis W Contrast  05/22/2012  *RADIOLOGY REPORT*  Clinical Data:  Abdominal pain, fever x3 days  CT ABDOMEN AND PELVIS WITH CONTRAST  Technique:  Multidetector CT imaging of the abdomen and pelvis was performed following the standard protocol during bolus administration of intravenous contrast.  Contrast: OMNIPAQUE IOHEXOL 300 MG/ML  SOLN, 50mL OMNIPAQUE IOHEXOL 300 MG/ML  SOLN  Comparison: Chest x-ray obtained earlier today 05/22/2012  Findings:  Lower Chest:   Patchy ground-glass attenuation opacity in the right lower lobe concerning for an infectious/inflammatory process. Visualized heart is within normal limits for size.  No pericardial effusion.  Unremarkable distal thoracic esophagus.  Unremarkable CT appearance of the stomach, duodenum, spleen, adrenal glands and pancreas.  Sub centimeter hypoattenuating lesion in hepatic segment six is too small to accurately characterize but statistically highly likely to represent a benign cyst or biliary hamartoma. Gallbladder is unremarkable. No intra or extrahepatic biliary ductal dilatation.  No hydronephrosis or nephrolithiasis.  Normal renal parenchymal enhancement.  Abdomen: Normal-caliber large and small bowel throughout the abdomen.  No evidence of obstruction.  Although the appendix is not definitively identified in the right lower quadrant secondary to a relative absence of intra-abdominal fat planes, there is no significant inflammatory change in the right lower quadrant to suggest acute appendicitis.  No free fluid or suspicious adenopathy.  Pelvis: Unremarkable bladder and prostate.  No free fluid.  Bones: No acute fracture or aggressive appearing lytic or blastic osseous lesion.  Vascular:  No significant atherosclerotic disease or due to vascular abnormality.  IMPRESSION:  1.  Patchy ground-glass attenuation opacity in the right lower lobe concerning for an infectious/inflammatory process such as early pneumonia.  2.  No acute abnormality in the abdomen or pelvis.   Original Report Authenticated By: Malachy Moan, M.D.     Medications:  Scheduled: . atazanavir  300 mg Oral Q breakfast  . atovaquone  1,500 mg Oral Q breakfast  . azithromycin  500 mg Intravenous Q24H  . cefTRIAXone (ROCEPHIN)  IV  1 g Intravenous Q24H  . emtricitabine-tenofovir  1 tablet Oral Daily  . feeding supplement  237 mL Oral TID PC & HS  . methimazole  10 mg Oral Daily  . multivitamin with minerals  1 tablet Oral Daily  .  ritonavir  100 mg Oral Daily   Continuous:  [id number]:[id number], menthol-cetylpyridinium, ondansetron (ZOFRAN) IV  Assessment/Plan:  Principal Problem:   PNA (pneumonia) Active Problems:   Nausea and vomiting   Hypokalemia   HIV (human immunodeficiency virus infection)   H/O syphilis   H/O gonorrhea   Graves disease   G6PD deficiency    Acute respiratory failure  Was likely secondary to CAP. Now better. Saturating well on RA. Ambulate  Community Acquired PNA (pneumonia), RLL  Had fever overnight. Though patient feels better. Urine legionella and strep pneumo negative. Appreciate ID input. Continue Iv abx.   Nausea and vomiting  Likely secondary to acute illness as noted above. Now resolved. Tolerating diet.   Hypokalemia  Repleted. Mg level is normal. Recheck in AM.  Constipation Resolved    History of HYPERTHYROIDISM  Continue Methimazole. TSH is 3.0.  HIV (human immunodeficiency virus infection)  ID following. On HAART. Atovaquone started for PCP prophylaxis. CD4 is 90.   DVT Prophylaxis: Patient refusing Enoxaparin. SCD's and ambulation. Code Status: Full  Family Communication: Pt at bedside  Disposition Plan: Likely return home when improved.    LOS: 2 days   [facility]  [facility] Pager [phone] 05/24/2012, 12:59 PM  If 8PM-8AM, please contact night-coverage at [email or web address], password [facility]

## 2012-05-24 NOTE — Progress Notes (Signed)
Patient ID: Steve Hernandez, male   DOB: July 16, 1975, 37 y.o.   MRN: [id number]         [facility]    Date of Admission:  05/22/2012            Day 2 ceftriaxone        Day 2 azithromycin  Principal Problem:   PNA (pneumonia) Active Problems:   HIV (human immunodeficiency virus infection)   Nausea and vomiting   Hypokalemia   H/O syphilis   H/O gonorrhea   Graves disease   G6PD deficiency   . atazanavir  300 mg Oral Q breakfast  . atovaquone  1,500 mg Oral Q breakfast  . azithromycin  500 mg Intravenous Q24H  . cefTRIAXone (ROCEPHIN)  IV  1 g Intravenous Q24H  . emtricitabine-tenofovir  1 tablet Oral Daily  . enoxaparin (LOVENOX) injection  40 mg Subcutaneous Q24H  . feeding supplement  237 mL Oral TID PC & HS  . methimazole  10 mg Oral Daily  . multivitamin with minerals  1 tablet Oral Daily  . ritonavir  100 mg Oral Daily    Subjective: He is not feeling well because of persistent cough overnight. His cough is productive of thin yellow sputum. He is also having some abdominal discomfort related to the cough and constipation.  Objective: Temp:  [98.5 F (36.9 C)-102.1 F (38.9 C)] 98.5 F (36.9 C) (04/11 0457) Pulse Rate:  [47-54] 50 (04/11 0608) Resp:  [16] 16 (04/11 0457) BP: (94-111)/(42-62) 108/53 mmHg (04/11 0608) SpO2:  [96 %-99 %] 99 % (04/11 0457)  General: He looks uncomfortable Skin:  No rash Lungs: Very few and faint basilar crackles posteriorly Cor: Regular S1 and S2 no murmurs Abdomen: Mild diffuse tenderness  Lab Results Lab Results  Component Value Date   WBC 2.1* 05/24/2012   HGB 11.5* 05/24/2012   HCT 35.5* 05/24/2012   MCV 78.9 05/24/2012   PLT 165 05/24/2012    Lab Results  Component Value Date   CREATININE 1.11 05/23/2012   BUN 12 05/23/2012   NA 135 05/23/2012   K 3.3* 05/23/2012   CL 100 05/23/2012   CO2 26 05/23/2012    Lab Results  Component Value Date   ALT 30 05/22/2012   AST 47* 05/22/2012   ALKPHOS 88  05/22/2012   BILITOT 0.7 05/22/2012    HIV 1 RNA Quant (copies/mL)  Date Value  05/22/2012 <20      CD4 T Cell Abs (cmm)  Date Value  05/22/2012 90*     Microbiology: Recent Results (from the past 240 hour(s))  CULTURE, BLOOD (ROUTINE X 2)     Status: None   Collection Time    05/22/12 10:10 PM      Result Value Range Status   Specimen Description BLOOD RIGHT ANTECUBITAL   Final   Special Requests     Final   Value: Immunocompromised BOTTLES DRAWN AEROBIC AND ANAEROBIC 5CC   Culture  Setup Time 05/23/2012 01:33   Final   Culture     Final   Value:        BLOOD CULTURE RECEIVED NO GROWTH TO DATE CULTURE WILL BE HELD FOR 5 DAYS BEFORE ISSUING A FINAL NEGATIVE REPORT   Report Status PENDING   Incomplete  CULTURE, BLOOD (ROUTINE X 2)     Status: None   Collection Time    05/22/12 10:15 PM      Result Value Range Status   Specimen Description  BLOOD RUA   Final   Special Requests     Final   Value: Immunocompromised BOTTLES DRAWN AEROBIC AND ANAEROBIC 4CC   Culture  Setup Time 05/23/2012 01:33   Final   Culture     Final   Value:        BLOOD CULTURE RECEIVED NO GROWTH TO DATE CULTURE WILL BE HELD FOR 5 DAYS BEFORE ISSUING A FINAL NEGATIVE REPORT   Report Status PENDING   Incomplete  CULTURE, EXPECTORATED SPUTUM-ASSESSMENT     Status: None   Collection Time    05/24/12  7:29 AM      Result Value Range Status   Specimen Description SPUTUM   Final   Special Requests NONE   Final   Sputum evaluation     Final   Value: MICROSCOPIC FINDINGS SUGGEST THAT THIS SPECIMEN IS NOT REPRESENTATIVE OF LOWER RESPIRATORY SECRETIONS. PLEASE RECOLLECT.     INFORMED Maylon Cos RN AT [phone] ON 04.11.14 BY SHUEA.   Report Status 05/24/2012 FINAL   Final    Studies/Results: Dg Chest 2 View  05/22/2012  *RADIOLOGY REPORT*  Clinical Data: cough  CHEST - 2 VIEW  Comparison: 05/10/11  Findings: Heart size is normal.  No pleural effusion or edema.  No airspace consolidation identified.  The visualized osseous  structures are unremarkable.  IMPRESSION:  1.  No acute cardiopulmonary abnormalities.   Original Report Authenticated By: Signa Kell, M.D.    Ct Abdomen Pelvis W Contrast  05/22/2012  *RADIOLOGY REPORT*  Clinical Data:  Abdominal pain, fever x3 days  CT ABDOMEN AND PELVIS WITH CONTRAST  Technique:  Multidetector CT imaging of the abdomen and pelvis was performed following the standard protocol during bolus administration of intravenous contrast.  Contrast: OMNIPAQUE IOHEXOL 300 MG/ML  SOLN, 50mL OMNIPAQUE IOHEXOL 300 MG/ML  SOLN  Comparison: Chest x-ray obtained earlier today 05/22/2012  Findings:  Lower Chest:  Patchy ground-glass attenuation opacity in the right lower lobe concerning for an infectious/inflammatory process. Visualized heart is within normal limits for size.  No pericardial effusion.  Unremarkable distal thoracic esophagus.  Unremarkable CT appearance of the stomach, duodenum, spleen, adrenal glands and pancreas.  Sub centimeter hypoattenuating lesion in hepatic segment six is too small to accurately characterize but statistically highly likely to represent a benign cyst or biliary hamartoma. Gallbladder is unremarkable. No intra or extrahepatic biliary ductal dilatation.  No hydronephrosis or nephrolithiasis.  Normal renal parenchymal enhancement.  Abdomen: Normal-caliber large and small bowel throughout the abdomen.  No evidence of obstruction.  Although the appendix is not definitively identified in the right lower quadrant secondary to a relative absence of intra-abdominal fat planes, there is no significant inflammatory change in the right lower quadrant to suggest acute appendicitis.  No free fluid or suspicious adenopathy.  Pelvis: Unremarkable bladder and prostate.  No free fluid.  Bones: No acute fracture or aggressive appearing lytic or blastic osseous lesion.  Vascular:  No significant atherosclerotic disease or due to vascular abnormality.  IMPRESSION:  1.  Patchy  ground-glass attenuation opacity in the right lower lobe concerning for an infectious/inflammatory process such as early pneumonia.  2.  No acute abnormality in the abdomen or pelvis.   Original Report Authenticated By: Malachy Moan, M.D.     Assessment: It is still too early to expect much response to empiric therapy for community-acquired pneumonia. Although his CD4 count is less than 100 I think pneumocystis is relatively unlikely given his clinical presentation and the fact that his HIV  viral load is now undetectable.  Plan: 1. Recommend continuing current antimicrobial therapy 2. Would treat for constipation  Cliffton Asters, MD [facility] [phone] pager   [phone] cell 05/24/2012, 10:39 AM

## 2012-05-25 LAB — BASIC METABOLIC PANEL
CO2: 28 mEq/L (ref 19–32)
GFR calc non Af Amer: 80 mL/min — ABNORMAL LOW (ref >90)
Glucose, Bld: 93 mg/dL (ref 70–99)
Potassium: 3.4 mEq/L — ABNORMAL LOW (ref 3.5–5.1)
Sodium: 136 mEq/L (ref 135–145)

## 2012-05-25 LAB — CBC
Hemoglobin: 10 g/dL — ABNORMAL LOW (ref 13.0–17.0)
Platelets: 137 10*3/uL — ABNORMAL LOW (ref 150–400)
RBC: 3.88 MIL/uL — ABNORMAL LOW (ref 4.22–5.81)

## 2012-05-25 LAB — RPR: RPR Ser Ql: REACTIVE — AB

## 2012-05-25 LAB — RPR TITER: RPR Titer: 1:16 {titer} — AB

## 2012-05-25 MED ORDER — SODIUM CHLORIDE 0.9 % IV SOLN
INTRAVENOUS | Status: DC
  Administered 2012-05-25: 04:00:00 via INTRAVENOUS

## 2012-05-25 MED ORDER — POTASSIUM CHLORIDE CRYS ER 20 MEQ PO TBCR
40.0000 meq | EXTENDED_RELEASE_TABLET | Freq: Once | ORAL | Status: AC
  Administered 2012-05-25: 40 meq via ORAL
  Filled 2012-05-25: qty 2

## 2012-05-25 NOTE — Progress Notes (Signed)
[facility] PROGRESS NOTE  Steve Hernandez WUJ:[id number] DOB: [birth year] DOA: 05/22/2012  PCP: ID at [facility]  Brief HPI: Pt is 37 yo male with HIV, last CD4# in 200, follows with ID specialist in [facility], presents with 2-3 days worsening shortness of breath, initially started with exertion and now present at rest, associated with subjective fevers, chills, cough productive of yellow sputum, nausea and non bloody vomiting. He denied chest pain except when coughing, no specific abdominal or urinary concerns, no other systemic symptoms, no recent sick contacts or exposures. In ED, CT findings suggestive of RLL PNA, Dr. Orvan Falconer with ID recommended CAP coverage and continuing HAART.  Past medical history:  Past Medical History  Diagnosis Date  . Hyperthyroidism   . HIV (human immunodeficiency virus infection)   . Hypertension     Consultants: ID  Procedures: None  Antibiotics: IV Ceftriaxone and Azithromycin 4/9  Subjective: Patient continues to feel better. Continues to cough yellow sputum. Had fever yesterday evening. Denies chest pain or shortness of breath or dizziness. Wants to go home.  Objective: Vital Signs  Filed Vitals:   05/24/12 2328 05/25/12 0030 05/25/12 0542 05/25/12 0639  BP:   96/43   Pulse:   65   Temp: 99.9 F (37.7 C) 99 F (37.2 C) 100.4 F (38 C) 99 F (37.2 C)  TempSrc: Oral Oral Oral Oral  Resp:   16   Height:      Weight:      SpO2:   93%     Intake/Output Summary (Last 24 hours) at 05/25/12 1156 Last data filed at 05/25/12 0537  Gross per 24 hour  Intake   2150 ml  Output      0 ml  Net   2150 ml   Filed Weights   05/22/12 2323  Weight: 73.573 kg (162 lb 3.2 oz)    Intake/Output from previous day: 04/11 0701 - 04/12 0700 In: 2150 [P.O.:480; I.V.:1670] Out: -   General appearance: alert, cooperative, appears stated age and no distress Head: Normocephalic, without obvious abnormality, atraumatic Resp: Crackles bilateral bases  R>L. No wheezing. Cardio: regular rate and rhythm, S1, S2 normal, no murmur, click, rub or gallop GI: soft, non-tender; bowel sounds normal; no masses,  no organomegaly Extremities: extremities normal, atraumatic, no cyanosis or edema Neurologic: Alert and oriented x 3. No focal deficits.  Lab Results:  Basic Metabolic Panel:  Recent Labs Lab 05/22/12 1604 05/22/12 2253 05/23/12 0400 05/23/12 1422 05/25/12 0400  NA 134*  --  137 135 136  K 3.3*  --  3.7 3.3* 3.4*  CL 95*  --  102 100 100  CO2 27  --  25 26 28   GLUCOSE 99  --  87 107* 93  BUN 17  --  15 12 10   CREATININE 1.31  --  1.26 1.11 1.15  CALCIUM 9.5  --  8.0* 8.4 8.4  MG  --  1.8  --   --   --    Liver Function Tests:  Recent Labs Lab 05/22/12 1604  AST 47*  ALT 30  ALKPHOS 88  BILITOT 0.7  PROT 8.9*  ALBUMIN 4.0    Recent Labs Lab 05/22/12 1604  LIPASE 20   No results found for this basename: AMMONIA,  in the last 168 hours CBC:  Recent Labs Lab 05/22/12 1604 05/23/12 0400 05/24/12 0352 05/25/12 0400  WBC 3.7* 4.1 2.1* 2.3*  NEUTROABS 1.7  --   --   --  HGB 12.6* 10.7* 11.5* 10.0*  HCT 38.2* 32.6* 35.5* 30.4*  MCV 78.3 78.6 78.9 78.4  PLT 220 151 165 137*    Studies/Results: No results found.  Medications:  Scheduled: . atazanavir  300 mg Oral Q breakfast  . atovaquone  1,500 mg Oral Q breakfast  . azithromycin  500 mg Intravenous Q24H  . cefTRIAXone (ROCEPHIN)  IV  1 g Intravenous Q24H  . emtricitabine-tenofovir  1 tablet Oral Daily  . feeding supplement  237 mL Oral TID PC & HS  . methimazole  10 mg Oral Daily  . multivitamin with minerals  1 tablet Oral Daily  . ritonavir  100 mg Oral Daily   Continuous:  WUJ:[id number], guaiFENesin, menthol-cetylpyridinium, ondansetron (ZOFRAN) IV  Assessment/Plan:  Principal Problem:   PNA (pneumonia) Active Problems:   Nausea and vomiting   Hypokalemia   HIV (human immunodeficiency virus infection)   H/O syphilis   H/O  gonorrhea   Graves disease   G6PD deficiency   Hyperthyroidism    Acute respiratory failure  Was likely secondary to CAP. Now better. Saturating well on RA. Ambulate  Community Acquired PNA (pneumonia), RLL  Had fever again yesterday. Patient continues to feel better however. Urine legionella and strep pneumo negative. Appreciate ID input. Continue Iv abx for now. Hopefully fever will resolve soon.   Nausea and vomiting  Likely secondary to acute illness as noted above. Now resolved. Tolerating diet.   Hypokalemia  Repleted. Mg level is normal. Recheck in AM.  Constipation Resolved    History of HYPERTHYROIDISM  Continue Methimazole. TSH is 3.0.  HIV (human immunodeficiency virus infection)  ID following. On HAART. Atovaquone started for PCP prophylaxis. CD4 is 90.   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 ID monitoring titers  DVT Prophylaxis: Patient refusing Enoxaparin. SCD's and ambulation. Risks/benefits explained. Code Status: Full  Family Communication: Pt at bedside  Disposition Plan: Likely return home when improved. Anticipate in 1-2 days.    LOS: 3 days   [facility]  [facility] Pager [phone] 05/25/2012, 11:56 AM  If 8PM-8AM, please contact night-coverage at [email or web address], password [facility]

## 2012-05-25 NOTE — Progress Notes (Signed)
Patient ID: Steve Hernandez, male   DOB: 1975/02/16, 37 y.o.   MRN: [id number]         [facility]    Date of Admission:  05/22/2012           Day 3 ceftriaxone        Day 3 azithromycin Principal Problem:   PNA (pneumonia) Active Problems:   HIV (human immunodeficiency virus infection)   Nausea and vomiting   Hypokalemia   H/O syphilis   H/O gonorrhea   Graves disease   G6PD deficiency   Hyperthyroidism   . atazanavir  300 mg Oral Q breakfast  . atovaquone  1,500 mg Oral Q breakfast  . azithromycin  500 mg Intravenous Q24H  . cefTRIAXone (ROCEPHIN)  IV  1 g Intravenous Q24H  . emtricitabine-tenofovir  1 tablet Oral Daily  . feeding supplement  237 mL Oral TID PC & HS  . methimazole  10 mg Oral Daily  . multivitamin with minerals  1 tablet Oral Daily  . ritonavir  100 mg Oral Daily    Subjective: He is feeling much better with decreased cough and sputum production. He is eager to go home. He is not sleeping well here.  Objective: Temp:  [99 F (37.2 C)-102.4 F (39.1 C)] 99 F (37.2 C) (04/12 0639) Pulse Rate:  [54-68] 65 (04/12 0542) Resp:  [16-18] 16 (04/12 0542) BP: (96-115)/(43-69) 96/43 mmHg (04/12 0542) SpO2:  [93 %-96 %] 93 % (04/12 0542)  General: He is sitting up in a chair appears comfortable Skin: No rash Lungs: Clear Cor: Regular S1 and S2 no murmurs Abdomen: Soft nontender    HIV 1 RNA Quant (copies/mL)  Date Value  05/22/2012 <20      CD4 T Cell Abs (cmm)  Date Value  05/22/2012 90*   RPR positive with titer of 16 GC and chlamydia assays negative  Blood cultures negative Sputum sample not representative of lower respiratory tract  Assessment: He continued to have fever overnight but he is improving clinically on empiric therapy for community-acquired pneumonia. His viral load is now suppressed but he has not had a robust CD4 increase since restarting antiretroviral therapy in January which makes immune  reconstitution syndrome unlikely. He was treated for early syphilis in January and his titer has fallen from 128 to16. His titers will need to be followed serially as an outpatient to verify the adequacy of his treatment.  Plan: 1. Continue ceftriaxone and azithromycin for now. He continues to improve will consider discharge tomorrow. 2. Continue current antiretroviral therapy  Cliffton Asters, MD [facility] [phone] pager   [phone] cell 05/25/2012, 11:03 AM

## 2012-05-26 LAB — BASIC METABOLIC PANEL
CO2: 26 mEq/L (ref 19–32)
Calcium: 8.5 mg/dL (ref 8.4–10.5)
GFR calc non Af Amer: 90 mL/min — ABNORMAL LOW (ref >90)
Glucose, Bld: 88 mg/dL (ref 70–99)
Potassium: 3.6 mEq/L (ref 3.5–5.1)
Sodium: 137 mEq/L (ref 135–145)

## 2012-05-26 LAB — CBC WITH DIFFERENTIAL/PLATELET
Basophils Relative: 1 % (ref 0–1)
Eosinophils Relative: 1 % (ref 0–5)
Hemoglobin: 10.1 g/dL — ABNORMAL LOW (ref 13.0–17.0)
MCH: 25.6 pg — ABNORMAL LOW (ref 26.0–34.0)
Monocytes Absolute: 0.2 10*3/uL (ref 0.1–1.0)
Monocytes Relative: 11 % (ref 3–12)
Neutrophils Relative %: 28 % — ABNORMAL LOW (ref 43–77)
Platelets: 144 10*3/uL — ABNORMAL LOW (ref 150–400)
RBC: 3.95 MIL/uL — ABNORMAL LOW (ref 4.22–5.81)
WBC: 2.1 10*3/uL — ABNORMAL LOW (ref 4.0–10.5)

## 2012-05-26 MED ORDER — ATOVAQUONE 750 MG/5ML PO SUSP: 1500.0000 mg | Freq: Every day | ORAL | Status: DC

## 2012-05-26 MED ORDER — LEVOFLOXACIN 500 MG PO TABS: 500.0000 mg | ORAL_TABLET | Freq: Every day | ORAL | Status: DC

## 2012-05-26 MED ORDER — LEVOFLOXACIN 500 MG PO TABS
500.0000 mg | ORAL_TABLET | Freq: Every day | ORAL | Status: DC
  Administered 2012-05-26: 500 mg via ORAL
  Filled 2012-05-26 (×2): qty 1

## 2012-05-26 NOTE — Progress Notes (Signed)
Patient ID: Steve Hernandez, male   DOB: 1975/07/12, 37 y.o.   MRN: [id number]         [facility]    Date of Admission:  05/22/2012           Day 3 ceftriaxone        Day 3 azithromycin Principal Problem:   PNA (pneumonia) Active Problems:   HIV (human immunodeficiency virus infection)   Nausea and vomiting   Hypokalemia   H/O syphilis   H/O gonorrhea   Graves disease   G6PD deficiency   Hyperthyroidism   . atazanavir  300 mg Oral Q breakfast  . atovaquone  1,500 mg Oral Q breakfast  . emtricitabine-tenofovir  1 tablet Oral Daily  . feeding supplement  237 mL Oral TID PC & HS  . levofloxacin  500 mg Oral Q breakfast  . methimazole  10 mg Oral Daily  . multivitamin with minerals  1 tablet Oral Daily  . ritonavir  100 mg Oral Daily    Subjective: He is feeling much better. His cough has resolved.  Objective: Temp:  [98 F (36.7 C)-99.2 F (37.3 C)] 99.2 F (37.3 C) (04/13 0654) Pulse Rate:  [42-56] 56 (04/13 0728) Resp:  [16-18] 16 (04/13 0654) BP: (94-103)/(51-61) 102/54 mmHg (04/13 0654) SpO2:  [99 %] 99 % (04/13 0654)  General: He looks well and is talking on the phone Skin: No rash Lungs: Clear  Lab Results Lab Results  Component Value Date   WBC 2.1* 05/26/2012   HGB 10.1* 05/26/2012   HCT 31.0* 05/26/2012   MCV 78.5 05/26/2012   PLT 144* 05/26/2012    Lab Results  Component Value Date   CREATININE 1.05 05/26/2012   BUN 10 05/26/2012   NA 137 05/26/2012   K 3.6 05/26/2012   CL 101 05/26/2012   CO2 26 05/26/2012    Lab Results  Component Value Date   ALT 30 05/22/2012   AST 47* 05/22/2012   ALKPHOS 88 05/22/2012   BILITOT 0.7 05/22/2012    HIV 1 RNA Quant (copies/mL)  Date Value  05/22/2012 <20      CD4 T Cell Abs (cmm)  Date Value  05/22/2012 90*   RPR down to 16   Microbiology: Blood cultures remain negative  Assessment: He is improving on empiric therapy for community-acquired pneumonia.  Plan: 1. Ready for discharge  home on oral levofloxacin to complete therapy for community-acquired pneumonia 2. Continue current antiretroviral therapy and atovaquone prophylaxis for pneumocystis 3. Have given him my contact information in case he needs assistance before his followup visit at [facility] in May  Cliffton Asters, MD [facility] [phone] pager   [phone] cell 05/26/2012, 11:50 AM

## 2012-05-26 NOTE — Progress Notes (Signed)
Patient discharged per MD order. Discharge instructions and two prescriptions given to patient. Pt stated he did not need a wheelchair and needed to walk so he ambulated himself to the car. Angelena Form, RN

## 2012-05-26 NOTE — Discharge Summary (Signed)
[facility]  Physician Discharge Summary   Patient ID: Steve Hernandez MRN: [id number] DOB/AGE: [birth year] 37 y.o.  Admit date: 05/22/2012 Discharge date: 05/26/2012  PCP: ID providers at [facility]  DISCHARGE DIAGNOSES:  Principal Problem:   PNA (pneumonia) Active Problems:   Nausea and vomiting   Hypokalemia   HIV (human immunodeficiency virus infection)   H/O syphilis   H/O gonorrhea   Graves disease   G6PD deficiency   Hyperthyroidism   RECOMMENDATIONS FOR OUTPATIENT FOLLOW UP: 1. Needs close follow up.  DISCHARGE CONDITION: fair  Diet recommendation: Regular  Filed Weights   05/22/12 2323  Weight: 73.573 kg (162 lb 3.2 oz)    INITIAL HISTORY: Pt is 37 yo male with HIV, last CD4# in 200, follows with ID specialist in [facility], presented with 2-3 days worsening shortness of breath, initially started with exertion and now present at rest, associated with subjective fevers, chills, cough productive of yellow sputum, nausea and non bloody vomiting. He denied chest pain except when coughing, no specific abdominal or urinary concerns, no other systemic symptoms, no recent sick contacts or exposures. In ED, CT findings suggestive of RLL PNA, Dr. Orvan Falconer with ID recommended CAP coverage and continuing HAART.   Consultations:  Inf Disease: Dr. Orvan Falconer  Procedures:  None  HOSPITAL COURSE:   Community Acquired PNA (pneumonia), RLL  Patient was started on ceftriaxone and azithromycin. He spiked fevers for about 2 days. Occasional low grade fever is noted. Symptomatically he has improved. He is breathing better and coughing less. Tolerating his diet. Had bowel movements. Urine legionella and strep pneumo negative. Appreciate ID input. Discussed with ID and Levofloxacin was recommended which will be prescribed. Blood cultures were negative.  Nausea and vomiting  This was likely secondary to acute illness as noted above. Now resolved. Tolerating diet.   Hypokalemia   This was repleted. Mg level was normal.   Constipation  Resolved with laxatives. TSH was 3.0.  History of HYPERTHYROIDISM  Continue Methimazole. Patient is taking 10mg  once daily. Says he could not tolerate taking it 3 times a day. He should discuss this with his providers. TSH is 3.0.  HIV (human immunodeficiency virus infection)  He is on HAART which was continued. Atovaquone started for PCP prophylaxis as CD4 is 90. Viral load was undetectable. He will follow with his ID providers at [facility]. GC and CT probes were negative.  History of Early Syphilis  Treated earlier this year. ID monitoring titers which are improving. Titer was 1:16.  Overall patient has improved. He is keen on going home. Since his respiratory status is stable he will be discharged. He will need close follow up with his providers which is trying to arrange.  PERTINENT LABS:  The results of significant diagnostics from this hospitalization (including imaging, microbiology, ancillary and laboratory) are listed below for reference.    Microbiology:   Labs: Basic Metabolic Panel:  Recent Labs Lab 05/22/12 1604 05/22/12 2253 05/23/12 0400 05/23/12 1422 05/25/12 0400 05/26/12 0357  NA 134*  --  137 135 136 137  K 3.3*  --  3.7 3.3* 3.4* 3.6  CL 95*  --  102 100 100 101  CO2 27  --  25 26 28 26   GLUCOSE 99  --  87 107* 93 88  BUN 17  --  15 12 10 10   CREATININE 1.31  --  1.26 1.11 1.15 1.05  CALCIUM 9.5  --  8.0* 8.4 8.4 8.5  MG  --  1.8  --   --   --   --  Liver Function Tests:  Recent Labs Lab 05/22/12 1604  AST 47*  ALT 30  ALKPHOS 88  BILITOT 0.7  PROT 8.9*  ALBUMIN 4.0    Recent Labs Lab 05/22/12 1604  LIPASE 20   No results found for this basename: AMMONIA,  in the last 168 hours CBC:  Recent Labs Lab 05/22/12 1604 05/23/12 0400 05/24/12 0352 05/25/12 0400 05/26/12 0357  WBC 3.7* 4.1 2.1* 2.3* 2.1*  NEUTROABS 1.7  --   --   --  0.6*  HGB 12.6* 10.7* 11.5* 10.0*  10.1*  HCT 38.2* 32.6* 35.5* 30.4* 31.0*  MCV 78.3 78.6 78.9 78.4 78.5  PLT 220 151 165 137* 144*    IMAGING STUDIES Dg Chest 2 View  05/22/2012  *RADIOLOGY REPORT*  Clinical Data: cough  CHEST - 2 VIEW  Comparison: 05/10/11  Findings: Heart size is normal.  No pleural effusion or edema.  No airspace consolidation identified.  The visualized osseous structures are unremarkable.  IMPRESSION:  1.  No acute cardiopulmonary abnormalities.   Original Report Authenticated By: Signa Kell, M.D.    Ct Abdomen Pelvis W Contrast  05/22/2012  *RADIOLOGY REPORT*  Clinical Data:  Abdominal pain, fever x3 days  CT ABDOMEN AND PELVIS WITH CONTRAST  Technique:  Multidetector CT imaging of the abdomen and pelvis was performed following the standard protocol during bolus administration of intravenous contrast.  Contrast: OMNIPAQUE IOHEXOL 300 MG/ML  SOLN, 50mL OMNIPAQUE IOHEXOL 300 MG/ML  SOLN  Comparison: Chest x-ray obtained earlier today 05/22/2012  Findings:  Lower Chest:  Patchy ground-glass attenuation opacity in the right lower lobe concerning for an infectious/inflammatory process. Visualized heart is within normal limits for size.  No pericardial effusion.  Unremarkable distal thoracic esophagus.  Unremarkable CT appearance of the stomach, duodenum, spleen, adrenal glands and pancreas.  Sub centimeter hypoattenuating lesion in hepatic segment six is too small to accurately characterize but statistically highly likely to represent a benign cyst or biliary hamartoma. Gallbladder is unremarkable. No intra or extrahepatic biliary ductal dilatation.  No hydronephrosis or nephrolithiasis.  Normal renal parenchymal enhancement.  Abdomen: Normal-caliber large and small bowel throughout the abdomen.  No evidence of obstruction.  Although the appendix is not definitively identified in the right lower quadrant secondary to a relative absence of intra-abdominal fat planes, there is no significant inflammatory change in  the right lower quadrant to suggest acute appendicitis.  No free fluid or suspicious adenopathy.  Pelvis: Unremarkable bladder and prostate.  No free fluid.  Bones: No acute fracture or aggressive appearing lytic or blastic osseous lesion.  Vascular:  No significant atherosclerotic disease or due to vascular abnormality.  IMPRESSION:  1.  Patchy ground-glass attenuation opacity in the right lower lobe concerning for an infectious/inflammatory process such as early pneumonia.  2.  No acute abnormality in the abdomen or pelvis.   Original Report Authenticated By: Malachy Moan, M.D.     DISCHARGE EXAMINATION: Filed Vitals:   05/25/12 1342 05/25/12 2111 05/26/12 0654 05/26/12 0728  BP: 94/51 103/61 102/54   Pulse: 46 53 42 56  Temp: 98 F (36.7 C) 98.7 F (37.1 C) 99.2 F (37.3 C)   TempSrc: Oral Oral Oral   Resp: 16 18 16    Height:      Weight:      SpO2: 99% 99% 99%    General appearance: alert, cooperative, appears stated age and no distress Resp: Few crackles r>l. Improved from admission. No wheezing. Cardio: regular rate and rhythm, S1, S2 normal,  no murmur, click, rub or gallop GI: soft, non-tender; bowel sounds normal; no masses,  no organomegaly Neurologic: Alert and oriented X 3, normal strength and tone. Normal symmetric reflexes. Normal coordination and gait  DISPOSITION: Home  Discharge Orders   Future Orders Complete By Expires     Diet general  As directed     Discharge instructions  As directed     Comments:      Please follow up with your doctors at [facility]. Seek attention if fever persists and not relieved with over the counter medications.    Increase activity slowly  As directed       Current Discharge Medication List    START taking these medications   Details  atovaquone (MEPRON) 750 MG/5ML suspension Take 10 mLs (1,500 mg total) by mouth daily with breakfast. Qty: 210 mL, Refills: 2    levofloxacin (LEVAQUIN) 500 MG tablet Take 1 tablet (500 mg total)  by mouth daily. For 4 days starting 05/27/12 Qty: 4 tablet, Refills: 0      CONTINUE these medications which have NOT CHANGED   Details  atazanavir (REYATAZ) 300 MG capsule Take 300 mg by mouth daily with breakfast.    emtricitabine-tenofovir (TRUVADA) 200-300 MG per tablet Take 1 tablet by mouth daily.    guaifenesin (ROBITUSSIN) 100 MG/5ML syrup Take 200 mg by mouth 3 (three) times daily as needed for cough or congestion.    methimazole (TAPAZOLE) 10 MG tablet Take 10 mg by mouth daily.    ritonavir (NORVIR) 100 MG capsule Take 100 mg by mouth daily.        Follow-up Information   Follow up with Providers at [facility] Schedule an appointment as soon as possible for a visit in 1 week.      TOTAL DISCHARGE TIME: 35 mins  [facility]  [facility] Pager [phone]  05/26/2012, 10:19 AM

## 2012-05-27 LAB — PATHOLOGIST SMEAR REVIEW

## 2012-05-27 LAB — T.PALLIDUM AB, IGG: T pallidum Antibodies (TP-PA): >8 {s_co_ratio} — ABNORMAL HIGH (ref <0.90)

## 2012-05-29 LAB — CULTURE, BLOOD (ROUTINE X 2)

## 2013-07-28 ENCOUNTER — Emergency Department (HOSPITAL_COMMUNITY): Payer: BC Managed Care – PPO

## 2013-07-28 ENCOUNTER — Emergency Department (HOSPITAL_COMMUNITY)
Admission: EM | Admit: 2013-07-28 | Discharge: 2013-07-28 | Disposition: A | Discharge status: Home / Self Care | Payer: BC Managed Care – PPO | Attending: Emergency Medicine | Admitting: Emergency Medicine

## 2013-07-28 ENCOUNTER — Encounter (HOSPITAL_COMMUNITY): Payer: Self-pay | Admitting: Emergency Medicine

## 2013-07-28 DIAGNOSIS — N390 Urinary tract infection, site not specified: Secondary | ICD-10-CM | POA: Insufficient documentation

## 2013-07-28 DIAGNOSIS — R109 Unspecified abdominal pain: Secondary | ICD-10-CM

## 2013-07-28 DIAGNOSIS — Z21 Asymptomatic human immunodeficiency virus [HIV] infection status: Secondary | ICD-10-CM | POA: Insufficient documentation

## 2013-07-28 DIAGNOSIS — R51 Headache: Secondary | ICD-10-CM | POA: Insufficient documentation

## 2013-07-28 DIAGNOSIS — Z79899 Other long term (current) drug therapy: Secondary | ICD-10-CM | POA: Insufficient documentation

## 2013-07-28 DIAGNOSIS — Z8639 Personal history of other endocrine, nutritional and metabolic disease: Secondary | ICD-10-CM | POA: Insufficient documentation

## 2013-07-28 DIAGNOSIS — I10 Essential (primary) hypertension: Secondary | ICD-10-CM | POA: Insufficient documentation

## 2013-07-28 DIAGNOSIS — Z862 Personal history of diseases of the blood and blood-forming organs and certain disorders involving the immune mechanism: Secondary | ICD-10-CM | POA: Insufficient documentation

## 2013-07-28 DIAGNOSIS — R519 Headache, unspecified: Secondary | ICD-10-CM

## 2013-07-28 DIAGNOSIS — R079 Chest pain, unspecified: Secondary | ICD-10-CM | POA: Insufficient documentation

## 2013-07-28 LAB — CBC WITH DIFFERENTIAL/PLATELET
BASOS ABS: 0 10*3/uL (ref 0.0–0.1)
BASOS PCT: 0 % (ref 0–1)
EOS ABS: 0.1 10*3/uL (ref 0.0–0.7)
Eosinophils Relative: 2 % (ref 0–5)
HEMATOCRIT: 37.9 % — AB (ref 39.0–52.0)
HEMOGLOBIN: 12.3 g/dL — AB (ref 13.0–17.0)
Lymphocytes Relative: 18 % (ref 12–46)
Lymphs Abs: 1.2 10*3/uL (ref 0.7–4.0)
MCH: 25.7 pg — AB (ref 26.0–34.0)
MCHC: 32.5 g/dL (ref 30.0–36.0)
MCV: 79.1 fL (ref 78.0–100.0)
MONO ABS: 0.7 10*3/uL (ref 0.1–1.0)
MONOS PCT: 10 % (ref 3–12)
NEUTROS ABS: 4.8 10*3/uL (ref 1.7–7.7)
NEUTROS PCT: 70 % (ref 43–77)
Platelets: 288 10*3/uL (ref 150–400)
RBC: 4.79 MIL/uL (ref 4.22–5.81)
RDW: 12.3 % (ref 11.5–15.5)
WBC: 6.9 10*3/uL (ref 4.0–10.5)

## 2013-07-28 LAB — BASIC METABOLIC PANEL
BUN: 16 mg/dL (ref 6–23)
CALCIUM: 9.8 mg/dL (ref 8.4–10.5)
CHLORIDE: 94 meq/L — AB (ref 96–112)
CO2: 26 mEq/L (ref 19–32)
CREATININE: 0.97 mg/dL (ref 0.50–1.35)
Glucose, Bld: 104 mg/dL — ABNORMAL HIGH (ref 70–99)
Potassium: 4.3 mEq/L (ref 3.7–5.3)
Sodium: 135 mEq/L — ABNORMAL LOW (ref 137–147)

## 2013-07-28 LAB — HEPATIC FUNCTION PANEL
ALT: 24 U/L (ref 0–53)
AST: 31 U/L (ref 0–37)
Albumin: 4.2 g/dL (ref 3.5–5.2)
Alkaline Phosphatase: 186 U/L — ABNORMAL HIGH (ref 39–117)
BILIRUBIN DIRECT: 0.3 mg/dL (ref 0.0–0.3)
BILIRUBIN INDIRECT: 2.1 mg/dL — AB (ref 0.3–0.9)
TOTAL PROTEIN: 9.8 g/dL — AB (ref 6.0–8.3)
Total Bilirubin: 2.4 mg/dL — ABNORMAL HIGH (ref 0.3–1.2)

## 2013-07-28 LAB — RAPID URINE DRUG SCREEN, HOSP PERFORMED
AMPHETAMINES: NOT DETECTED
BARBITURATES: NOT DETECTED
BENZODIAZEPINES: NOT DETECTED
Cocaine: NOT DETECTED
Opiates: NOT DETECTED
Tetrahydrocannabinol: POSITIVE — AB

## 2013-07-28 LAB — URINALYSIS, ROUTINE W REFLEX MICROSCOPIC
Glucose, UA: NEGATIVE mg/dL
HGB URINE DIPSTICK: NEGATIVE
Ketones, ur: NEGATIVE mg/dL
Nitrite: NEGATIVE
PROTEIN: 30 mg/dL — AB
SPECIFIC GRAVITY, URINE: 1.025 (ref 1.005–1.030)
Urobilinogen, UA: 1 mg/dL (ref 0.0–1.0)
pH: 5.5 (ref 5.0–8.0)

## 2013-07-28 LAB — I-STAT TROPONIN, ED: TROPONIN I, POC: 0.01 ng/mL (ref 0.00–0.08)

## 2013-07-28 LAB — URINE MICROSCOPIC-ADD ON

## 2013-07-28 LAB — D-DIMER, QUANTITATIVE (NOT AT ARMC): D DIMER QUANT: 0.35 ug{FEU}/mL (ref 0.00–0.48)

## 2013-07-28 LAB — I-STAT CG4 LACTIC ACID, ED: LACTIC ACID, VENOUS: 0.95 mmol/L (ref 0.5–2.2)

## 2013-07-28 LAB — LIPASE, BLOOD: LIPASE: 23 U/L (ref 11–59)

## 2013-07-28 MED ORDER — ONDANSETRON HCL 4 MG/2ML IJ SOLN
4.0000 mg | Freq: Once | INTRAMUSCULAR | Status: AC
  Administered 2013-07-28: 4 mg via INTRAVENOUS
  Filled 2013-07-28: qty 2

## 2013-07-28 MED ORDER — IOHEXOL 300 MG/ML  SOLN
100.0000 mL | Freq: Once | INTRAMUSCULAR | Status: AC | PRN
  Administered 2013-07-28: 100 mL via INTRAVENOUS

## 2013-07-28 MED ORDER — CIPROFLOXACIN HCL 500 MG PO TABS: 500.0000 mg | ORAL_TABLET | Freq: Two times a day (BID) | ORAL | Status: DC

## 2013-07-28 MED ORDER — HYDROCODONE-ACETAMINOPHEN 5-325 MG PO TABS: 1.0000 | ORAL_TABLET | ORAL | Status: DC | PRN

## 2013-07-28 MED ORDER — SODIUM CHLORIDE 0.9 % IV BOLUS (SEPSIS)
1000.0000 mL | Freq: Once | INTRAVENOUS | Status: AC
Start: 2013-07-28 — End: 2013-07-28
  Administered 2013-07-28: 1000 mL via INTRAVENOUS

## 2013-07-28 MED ORDER — MORPHINE SULFATE 4 MG/ML IJ SOLN
4.0000 mg | Freq: Once | INTRAMUSCULAR | Status: AC
  Administered 2013-07-28: 4 mg via INTRAVENOUS
  Filled 2013-07-28: qty 1

## 2013-07-28 MED ORDER — ONDANSETRON HCL 4 MG PO TABS: 4.0000 mg | ORAL_TABLET | Freq: Four times a day (QID) | ORAL | Status: DC

## 2013-07-28 MED ORDER — IOHEXOL 300 MG/ML  SOLN
50.0000 mL | Freq: Once | INTRAMUSCULAR | Status: AC | PRN
  Administered 2013-07-28: 50 mL via ORAL

## 2013-07-28 MED ORDER — ASPIRIN 81 MG PO CHEW
324.0000 mg | CHEWABLE_TABLET | Freq: Once | ORAL | Status: AC
  Administered 2013-07-28: 324 mg via ORAL
  Filled 2013-07-28: qty 4

## 2013-07-28 NOTE — ED Provider Notes (Addendum)
TIME SEEN: 4:45 PM  CHIEF COMPLAINT: Headache, chest pain, abdominal pain  HPI: Patient is a 38 y.o. M with history of hyperthyroidism, hypertension, HIV who presents to the emergency department with multiple complaints. He states in the past 3 months he has had right-sided headaches or radiating to left side of his head. He describes the pain as throbbing it is worse with light and sounds. Pain gets better with Tylenol and Aleve. Denies any sudden onset headache or worst headache of his life. Denies any head injury and is not on anticoagulation. No numbness or focal weakness. No bowel or bladder incontinence.  Patient also complains of central chest pain that he describes as a tightness that is worse with deep inspiration he states is associated with shortness of breath. He states that this is been going on for the past 2 months. Denies a history of CAD, PE or DVT. No recent prolonged immobilization, surgery, fracture, trauma. No calf pain or swelling.  Patient is also complaining of intermittent abdominal pain for the past 2 months as well he describes as a sharp pain with no aggravating or relieving factors. No dysuria or hematuria. No bloody stools or melena. He has had nausea and one episode of vomiting today. No diarrhea. No sick contacts or recent travel. He states he has had subjective fevers.  ROS: See HPI Constitutional: Subjective fever  Eyes: no drainage  ENT: no runny nose   Cardiovascular:  chest pain  Resp:  SOB  GI:  vomiting GU: no dysuria Integumentary: no rash  Allergy: no hives  Musculoskeletal: no leg swelling  Neurological: no slurred speech ROS otherwise negative  PAST MEDICAL HISTORY/PAST SURGICAL HISTORY:  Past Medical History  Diagnosis Date  . Hyperthyroidism   . HIV (human immunodeficiency virus infection)   . Hypertension     MEDICATIONS:  Prior to Admission medications   Medication Sig Start Date End Date Taking? Authorizing Provider  atazanavir  (REYATAZ) 300 MG capsule Take 300 mg by mouth daily with breakfast.   Yes Historical Provider, MD  atovaquone (MEPRON) 750 MG/5ML suspension Take 10 mLs (1,500 mg total) by mouth daily with breakfast. 05/26/12  Yes Osvaldo ShipperGokul Krishnan, MD  emtricitabine-tenofovir (TRUVADA) 200-300 MG per tablet Take 1 tablet by mouth daily.   Yes Historical Provider, MD  ibuprofen (ADVIL,MOTRIN) 200 MG tablet Take 800 mg by mouth every 6 (six) hours as needed (pain).   Yes Historical Provider, MD  methimazole (TAPAZOLE) 10 MG tablet Take 10 mg by mouth daily.   Yes Historical Provider, MD  naproxen (NAPROSYN) 500 MG tablet Take 500 mg by mouth 2 (two) times daily as needed (pain).   Yes Historical Provider, MD  ritonavir (NORVIR) 100 MG capsule Take 100 mg by mouth daily.    Yes Historical Provider, MD    ALLERGIES:  Allergies  Allergen Reactions  . Sulfamethoxazole-Trimethoprim Hives    SOCIAL HISTORY:  History  Substance Use Topics  . Smoking status: Never Smoker   . Smokeless tobacco: Not on file  . Alcohol Use: No    FAMILY HISTORY: Family History  Problem Relation Age of Onset  . Hypertension Other   . Cancer Other   . Diabetes Mother     EXAM: BP 120/69  Pulse 51  Temp(Src) 98.2 F (36.8 C) (Oral)  Resp 12  SpO2 100% CONSTITUTIONAL: Alert and oriented and responds appropriately to questions. Well-appearing; well-nourished, nontoxic, in no apparent distress HEAD: Normocephalic EYES: Conjunctivae clear, PERRL ENT: normal nose; no rhinorrhea; moist mucous  membranes; pharynx without lesions noted NECK: Supple, no meningismus, no LAD  CARD: RRR; S1 and S2 appreciated; no murmurs, no clicks, no rubs, no gallops RESP: Normal chest excursion without splinting or tachypnea; breath sounds clear and equal bilaterally; no wheezes, no rhonchi, no rales,  ABD/GI: Normal bowel sounds; non-distended; soft, patient is tender to palpation diffusely across the lower abdomen with no guarding or rebound, no  peritoneal signs BACK:  The back appears normal and is nontender to palpation, no CVA tenderness, no midline spinal tenderness or step-off or deformity EXT: Normal ROM in all joints; non-tender to palpation; no edema; normal capillary refill; no cyanosis    SKIN: Normal color for age and race; warm NEURO: Moves all extremities equally, sensation to light touch intact diffusely, no pronator drift, cranial nerves II through XII intact PSYCH: The patient's mood and manner are appropriate. Grooming and personal hygiene are appropriate.  MEDICAL DECISION MAKING: Patient here with multiple complaints. He does have a history of HIV and in April 2014 had a CD4 count of 90 and a nondetectable viral load. We'll work him up for possible opportunistic infection, CAD, PE, UTI, colitis. Will obtain labs, cultures, lactate, CT head, chest x-ray, urine, CT of his abdomen and pelvis. We'll give pain and nausea medicine and IV fluids.  ED PROGRESS: Patient's labs are unremarkable. Troponin negative. D-dimer is also normal. Chest x-ray clear. EKG shows early repolarization with no reciprocal changes and is unchanged compared to prior EKG. CT of his head and abdomen are pending. Urinalysis pending.   7:21 PM  Pt's urine shows leukocytes and many bacteria. Culture pending. Given he does have lower abdominal pain with antibiotic for possible UTI. He is not concerned for sexually transmitted diseases. No penile discharge, testicular pain or swelling. CT scan did show some mild pancreatic ductal dilatation with no sign of obstruction and his bilirubin is slightly elevated at 2.4. He denies having any right upper quadrant abdominal pain or epigastric pain and his abdominal exam and this area is benign. Have discussed with patient that if he begins to develop pain in this area, jaundice or scleral icterus, vomiting and cannot stop that he needs to return to the emergency department for further evaluation.  Otherwise the CT scan  is negative. Appendix was not visualized but he has no tenderness at McBurney's point and there is no inflammatory changes on CT scan around the area of the appendix.  He states that he recently had his CD4 count rechecked and thinks that it is higher than in April 2014 but he cannot tell me what the CD4 count was. He states he is feeling much better and ready for discharge home.  He is comfortable with this plan for discharge home with close monitoring.   EKG Interpretation  Date/Time:  Monday July 28 2013 16:30:00 EDT Ventricular Rate:  53 PR Interval:  183 QRS Duration: 70 QT Interval:  433 QTC Calculation: 406 R Axis:   90 Text Interpretation:  Sinus rhythm Consider left atrial enlargement Left ventricular hypertrophy ST elevation likely early repolarization No reciprocal changes Unchanged compared to prior EKG March 2013 Confirmed by Mallard BayWARD,  DO, KRISTEN [phone]([id number]) on 07/28/2013 5:16:28 PM        Layla MawKristen N Ward, DO 07/28/13 1922  Layla MawKristen N Ward, DO 07/28/13 1925

## 2013-07-28 NOTE — ED Notes (Signed)
Pt bradycardiac.MD aware.

## 2013-07-28 NOTE — ED Notes (Addendum)
Pt with abdominal pain with headache x 2-3 months.  Pain in lower chest to upper abdomen Is worse after eating.  Pt also states chest pain off and on. Will do EKG

## 2013-07-28 NOTE — Discharge Instructions (Signed)
Chest Pain (Nonspecific) °It is often hard to give a specific diagnosis for the cause of chest pain. There is always a chance that your pain could be related to something serious, such as a heart attack or a blood clot in the lungs. You need to follow up with your caregiver for further evaluation. °CAUSES  °· Heartburn. °· Pneumonia or bronchitis. °· Anxiety or stress. °· Inflammation around your heart (pericarditis) or lung (pleuritis or pleurisy). °· A blood clot in the lung. °· A collapsed lung (pneumothorax). It can develop suddenly on its own (spontaneous pneumothorax) or from injury (trauma) to the chest. °· Shingles infection (herpes zoster virus). °The chest wall is composed of bones, muscles, and cartilage. Any of these can be the source of the pain. °· The bones can be bruised by injury. °· The muscles or cartilage can be strained by coughing or overwork. °· The cartilage can be affected by inflammation and become sore (costochondritis). °DIAGNOSIS  °Lab tests or other studies, such as X-rays, electrocardiography, stress testing, or cardiac imaging, may be needed to find the cause of your pain.  °TREATMENT  °· Treatment depends on what may be causing your chest pain. Treatment may include: °· Acid blockers for heartburn. °· Anti-inflammatory medicine. °· Pain medicine for inflammatory conditions. °· Antibiotics if an infection is present. °· You may be advised to change lifestyle habits. This includes stopping smoking and avoiding alcohol, caffeine, and chocolate. °· You may be advised to keep your head raised (elevated) when sleeping. This reduces the chance of acid going backward from your stomach into your esophagus. °· Most of the time, nonspecific chest pain will improve within 2 to 3 days with rest and mild pain medicine. °HOME CARE INSTRUCTIONS  °· If antibiotics were prescribed, take your antibiotics as directed. Finish them even if you start to feel better. °· For the next few days, avoid physical  activities that bring on chest pain. Continue physical activities as directed. °· Do not smoke. °· Avoid drinking alcohol. °· Only take over-the-counter or prescription medicine for pain, discomfort, or fever as directed by your caregiver. °· Follow your caregiver's suggestions for further testing if your chest pain does not go away. °· Keep any follow-up appointments you made. If you do not go to an appointment, you could develop lasting (chronic) problems with pain. If there is any problem keeping an appointment, you must call to reschedule. °SEEK MEDICAL CARE IF:  °· You think you are having problems from the medicine you are taking. Read your medicine instructions carefully. °· Your chest pain does not go away, even after treatment. °· You develop a rash with blisters on your chest. °SEEK IMMEDIATE MEDICAL CARE IF:  °· You have increased chest pain or pain that spreads to your arm, neck, jaw, back, or abdomen. °· You develop shortness of breath, an increasing cough, or you are coughing up blood. °· You have severe back or abdominal pain, feel nauseous, or vomit. °· You develop severe weakness, fainting, or chills. °· You have a fever. °THIS IS AN EMERGENCY. Do not wait to see if the pain will go away. Get medical help at once. Call your local emergency services (911 in [place] Do not drive yourself to the hospital. °MAKE SURE YOU:  °· Understand these instructions. °· Will watch your condition. °· Will get help right away if you are not doing well or get worse. °Document Released: 11/09/2004 Document Revised: 04/24/2011 Document Reviewed: 09/05/2007 °ExitCare® Patient Information ©2014 [employer],   LLC.  Migraine Headache A migraine headache is an intense, throbbing pain on one or both sides of your head. A migraine can last for 30 minutes to several hours. CAUSES  The exact cause of a migraine headache is not always known. However, a migraine may be caused when nerves in the brain become irritated and release  chemicals that cause inflammation. This causes pain. Certain things may also trigger migraines, such as:  Alcohol.  Smoking.  Stress.  Menstruation.  Aged cheeses.  Foods or drinks that contain nitrates, glutamate, aspartame, or tyramine.  Lack of sleep.  Chocolate.  Caffeine.  Hunger.  Physical exertion.  Fatigue.  Medicines used to treat chest pain (nitroglycerine), birth control pills, estrogen, and some blood pressure medicines. SIGNS AND SYMPTOMS  Pain on one or both sides of your head.  Pulsating or throbbing pain.  Severe pain that prevents daily activities.  Pain that is aggravated by any physical activity.  Nausea, vomiting, or both.  Dizziness.  Pain with exposure to bright lights, loud noises, or activity.  General sensitivity to bright lights, loud noises, or smells. Before you get a migraine, you may get warning signs that a migraine is coming (aura). An aura may include:  Seeing flashing lights.  Seeing bright spots, halos, or zig-zag lines.  Having tunnel vision or blurred vision.  Having feelings of numbness or tingling.  Having trouble talking.  Having muscle weakness. DIAGNOSIS  A migraine headache is often diagnosed based on:  Symptoms.  Physical exam.  A CT scan or MRI of your head. These imaging tests cannot diagnose migraines, but they can help rule out other causes of headaches. TREATMENT Medicines may be given for pain and nausea. Medicines can also be given to help prevent recurrent migraines.  HOME CARE INSTRUCTIONS  Only take over-the-counter or prescription medicines for pain or discomfort as directed by your health care provider. The use of long-term narcotics is not recommended.  Lie down in a dark, quiet room when you have a migraine.  Keep a journal to find out what may trigger your migraine headaches. For example, write down:  What you eat and drink.  How much sleep you get.  Any change to your diet or  medicines.  Limit alcohol consumption.  Quit smoking if you smoke.  Get 7 9 hours of sleep, or as recommended by your health care provider.  Limit stress.  Keep lights dim if bright lights bother you and make your migraines worse. SEEK IMMEDIATE MEDICAL CARE IF:   Your migraine becomes severe.  You have a fever.  You have a stiff neck.  You have vision loss.  You have muscular weakness or loss of muscle control.  You start losing your balance or have trouble walking.  You feel faint or pass out.  You have severe symptoms that are different from your first symptoms. MAKE SURE YOU:   Understand these instructions.  Will watch your condition.  Will get help right away if you are not doing well or get worse. Document Released: 01/30/2005 Document Revised: 11/20/2012 Document Reviewed: 10/07/2012 [facility] Patient Information 2014 [place]Care, [place].  Urinary Tract Infection Urinary tract infections (UTIs) can develop anywhere along your urinary tract. Your urinary tract is your body's drainage system for removing wastes and extra water. Your urinary tract includes two kidneys, two ureters, a bladder, and a urethra. Your kidneys are a pair of bean-shaped organs. Each kidney is about the size of your fist. They are located below your ribs,  one on each side of your spine. CAUSES Infections are caused by microbes, which are microscopic organisms, including fungi, viruses, and bacteria. These organisms are so small that they can only be seen through a microscope. Bacteria are the microbes that most commonly cause UTIs. SYMPTOMS  Symptoms of UTIs may vary by age and gender of the patient and by the location of the infection. Symptoms in young women typically include a frequent and intense urge to urinate and a painful, burning feeling in the bladder or urethra during urination. Older women and men are more likely to be tired, shaky, and weak and have muscle aches and abdominal pain. A  fever may mean the infection is in your kidneys. Other symptoms of a kidney infection include pain in your back or sides below the ribs, nausea, and vomiting. DIAGNOSIS To diagnose a UTI, your caregiver will ask you about your symptoms. Your caregiver also will ask to provide a urine sample. The urine sample will be tested for bacteria and white blood cells. White blood cells are made by your body to help fight infection. TREATMENT  Typically, UTIs can be treated with medication. Because most UTIs are caused by a bacterial infection, they usually can be treated with the use of antibiotics. The choice of antibiotic and length of treatment depend on your symptoms and the type of bacteria causing your infection. HOME CARE INSTRUCTIONS  If you were prescribed antibiotics, take them exactly as your caregiver instructs you. Finish the medication even if you feel better after you have only taken some of the medication.  Drink enough water and fluids to keep your urine clear or pale yellow.  Avoid caffeine, tea, and carbonated beverages. They tend to irritate your bladder.  Empty your bladder often. Avoid holding urine for long periods of time.  Empty your bladder before and after sexual intercourse.  After a bowel movement, women should cleanse from front to back. Use each tissue only once. SEEK MEDICAL CARE IF:   You have back pain.  You develop a fever.  Your symptoms do not begin to resolve within 3 days. SEEK IMMEDIATE MEDICAL CARE IF:   You have severe back pain or lower abdominal pain.  You develop chills.  You have nausea or vomiting.  You have continued burning or discomfort with urination. MAKE SURE YOU:   Understand these instructions.  Will watch your condition.  Will get help right away if you are not doing well or get worse. Document Released: 11/09/2004 Document Revised: 08/01/2011 Document Reviewed: 03/10/2011 [facility] Patient Information 2014 [place]ExitCare, [place]LLC.

## 2013-07-30 LAB — URINE CULTURE: Colony Count: 15000

## 2013-08-03 LAB — CULTURE, BLOOD (ROUTINE X 2)
CULTURE: NO GROWTH
Culture: NO GROWTH

## 2015-08-14 ENCOUNTER — Encounter (HOSPITAL_COMMUNITY): Payer: Self-pay

## 2015-08-14 ENCOUNTER — Emergency Department (HOSPITAL_COMMUNITY)
Admission: EM | Admit: 2015-08-14 | Discharge: 2015-08-14 | Disposition: A | Discharge status: Home / Self Care | Payer: Self-pay | Attending: Emergency Medicine | Admitting: Emergency Medicine

## 2015-08-14 DIAGNOSIS — E0590 Thyrotoxicosis, unspecified without thyrotoxic crisis or storm: Secondary | ICD-10-CM | POA: Insufficient documentation

## 2015-08-14 DIAGNOSIS — A084 Viral intestinal infection, unspecified: Secondary | ICD-10-CM | POA: Insufficient documentation

## 2015-08-14 DIAGNOSIS — I10 Essential (primary) hypertension: Secondary | ICD-10-CM | POA: Insufficient documentation

## 2015-08-14 DIAGNOSIS — Z21 Asymptomatic human immunodeficiency virus [HIV] infection status: Secondary | ICD-10-CM | POA: Insufficient documentation

## 2015-08-14 DIAGNOSIS — Z79899 Other long term (current) drug therapy: Secondary | ICD-10-CM | POA: Insufficient documentation

## 2015-08-14 DIAGNOSIS — Z791 Long term (current) use of non-steroidal anti-inflammatories (NSAID): Secondary | ICD-10-CM | POA: Insufficient documentation

## 2015-08-14 LAB — COMPREHENSIVE METABOLIC PANEL
ALT: 45 U/L (ref 17–63)
AST: 51 U/L — ABNORMAL HIGH (ref 15–41)
Albumin: 4.8 g/dL (ref 3.5–5.0)
Alkaline Phosphatase: 80 U/L (ref 38–126)
Anion gap: 12 (ref 5–15)
BUN: 15 mg/dL (ref 6–20)
CHLORIDE: 102 mmol/L (ref 101–111)
CO2: 24 mmol/L (ref 22–32)
Calcium: 9.5 mg/dL (ref 8.9–10.3)
Creatinine, Ser: 1.26 mg/dL — ABNORMAL HIGH (ref 0.61–1.24)
GFR calc Af Amer: >60 mL/min (ref >60)
GFR calc non Af Amer: >60 mL/min (ref >60)
Glucose, Bld: 105 mg/dL — ABNORMAL HIGH (ref 65–99)
Potassium: 3.9 mmol/L (ref 3.5–5.1)
SODIUM: 138 mmol/L (ref 135–145)
Total Bilirubin: 1 mg/dL (ref 0.3–1.2)
Total Protein: 9 g/dL — ABNORMAL HIGH (ref 6.5–8.1)

## 2015-08-14 LAB — CBC
HCT: 45.9 % (ref 39.0–52.0)
HEMOGLOBIN: 14.9 g/dL (ref 13.0–17.0)
MCH: 27.9 pg (ref 26.0–34.0)
MCHC: 32.5 g/dL (ref 30.0–36.0)
MCV: 86 fL (ref 78.0–100.0)
Platelets: 186 10*3/uL (ref 150–400)
RBC: 5.34 MIL/uL (ref 4.22–5.81)
RDW: 14.4 % (ref 11.5–15.5)
WBC: 4.6 10*3/uL (ref 4.0–10.5)

## 2015-08-14 LAB — LIPASE, BLOOD: LIPASE: 19 U/L (ref 11–51)

## 2015-08-14 MED ORDER — ONDANSETRON 4 MG PO TBDP: 4.0000 mg | ORAL_TABLET | Freq: Three times a day (TID) | ORAL | Status: DC | PRN

## 2015-08-14 MED ORDER — SODIUM CHLORIDE 0.9 % IV BOLUS (SEPSIS)
1000.0000 mL | Freq: Once | INTRAVENOUS | Status: AC
  Administered 2015-08-14: 1000 mL via INTRAVENOUS

## 2015-08-14 MED ORDER — GLYCOPYRROLATE 0.2 MG/ML IJ SOLN
0.1000 mg | Freq: Once | INTRAMUSCULAR | Status: AC
  Administered 2015-08-14: 0.1 mg via INTRAVENOUS
  Filled 2015-08-14: qty 1

## 2015-08-14 MED ORDER — DIPHENOXYLATE-ATROPINE 2.5-0.025 MG PO TABS: 1.0000 | ORAL_TABLET | Freq: Four times a day (QID) | ORAL | Status: DC | PRN

## 2015-08-14 MED ORDER — MORPHINE SULFATE (PF) 4 MG/ML IV SOLN
4.0000 mg | INTRAVENOUS | Status: DC | PRN
  Administered 2015-08-14: 4 mg via INTRAVENOUS
  Filled 2015-08-14: qty 1

## 2015-08-14 MED ORDER — DICYCLOMINE HCL 20 MG PO TABS: 20.0000 mg | ORAL_TABLET | Freq: Two times a day (BID) | ORAL | Status: DC

## 2015-08-14 MED ORDER — ONDANSETRON HCL 4 MG/2ML IJ SOLN
4.0000 mg | Freq: Once | INTRAMUSCULAR | Status: AC
  Administered 2015-08-14: 4 mg via INTRAVENOUS
  Filled 2015-08-14: qty 2

## 2015-08-14 MED ORDER — DIPHENOXYLATE-ATROPINE 2.5-0.025 MG PO TABS
2.0000 | ORAL_TABLET | Freq: Once | ORAL | Status: AC
  Administered 2015-08-14: 2 via ORAL
  Filled 2015-08-14: qty 2

## 2015-08-14 NOTE — ED Notes (Signed)
Pt unable to provide urine sample at this time 

## 2015-08-14 NOTE — ED Notes (Signed)
Patient c/o abdominal pain, diarrhea and vomiting x2 days.  Patient states that has been unable to keep food or fluids down.  Patient denies blood in stool or vomitus.  Patient states that daughter was ill with same symptoms.  Patient rates pain 10/10.

## 2015-08-14 NOTE — Discharge Instructions (Signed)

## 2015-08-14 NOTE — ED Provider Notes (Addendum)
CSN: [id number]     Arrival date & time 08/14/15  0235 History   First MD Initiated Contact with Patient 08/14/15 0430     Chief Complaint  Patient presents with  . Abdominal Pain  . Emesis  . Diarrhea      HPI  Patient presents evaluation of developing nausea and vomiting. Symptomatic since early yesterday morning. Nausea vomiting and diarrhea. No fevers.  Heme-negative nonbilious emesis. No blood pus or mucus in the stools. No difficulty breathing. No recent foreign travel. Has history of HIV. He states his counts of "good" he follows at [facility]. States he is compliant with his triple therapy.  He states his 40 year old daughter had similar symptoms within the last several days as well and recovered.  Past Medical History  Diagnosis Date  . Hyperthyroidism   . HIV (human immunodeficiency virus infection) (HCC)   . Hypertension    History reviewed. No pertinent past surgical history. Family History  Problem Relation Age of Onset  . Hypertension Other   . Cancer Other   . Diabetes Mother    Social History  Substance Use Topics  . Smoking status: Never Smoker   . Smokeless tobacco: None  . Alcohol Use: No    Review of Systems  Constitutional: Negative for fever, chills, diaphoresis, appetite change and fatigue.  HENT: Negative for mouth sores, sore throat and trouble swallowing.   Eyes: Negative for visual disturbance.  Respiratory: Negative for cough, chest tightness, shortness of breath and wheezing.   Cardiovascular: Negative for chest pain.  Gastrointestinal: Positive for nausea, vomiting, abdominal pain and diarrhea. Negative for abdominal distention.  Endocrine: Negative for polydipsia, polyphagia and polyuria.  Genitourinary: Negative for dysuria, frequency and hematuria.  Musculoskeletal: Negative for gait problem.  Skin: Negative for color change, pallor and rash.  Neurological: Negative for dizziness, syncope, light-headedness  and headaches.  Hematological: Does not bruise/bleed easily.  Psychiatric/Behavioral: Negative for behavioral problems and confusion.      Allergies  Sulfamethoxazole-trimethoprim and Primaquine  Home Medications   Prior to Admission medications   Medication Sig Start Date End Date Taking? Authorizing Provider  emtricitabine-tenofovir (TRUVADA) 200-300 MG per tablet Take 1 tablet by mouth daily.   Yes Historical Provider, MD  HYDROcodone-acetaminophen (NORCO) 10-325 MG tablet Take 1 tablet by mouth every 4 (four) hours as needed (for pain.).   Yes Historical Provider, MD  naproxen (NAPROSYN) 500 MG tablet Take 500 mg by mouth 2 (two) times daily as needed (pain).   Yes Historical Provider, MD  NORVIR 100 MG TABS tablet Take 100 mg by mouth daily with breakfast. 07/22/15  Yes Historical Provider, MD  REYATAZ 200 MG capsule Take 400 mg by mouth daily. 07/22/15  Yes Historical Provider, MD  ritonavir (NORVIR) 100 MG capsule Take 100 mg by mouth daily.    Yes Historical Provider, MD  dicyclomine (BENTYL) 20 MG tablet Take 1 tablet (20 mg total) by mouth 2 (two) times daily. 08/14/15   Rolland PorterMark Renwick Asman, MD  diphenoxylate-atropine (LOMOTIL) 2.5-0.025 MG tablet Take 1 tablet by mouth 4 (four) times daily as needed for diarrhea or loose stools. 08/14/15   Rolland PorterMark Lorenso Quirino, MD  ondansetron (ZOFRAN ODT) 4 MG disintegrating tablet Take 1 tablet (4 mg total) by mouth every 8 (eight) hours as needed for nausea. 08/14/15   Rolland PorterMark Saraiya Kozma, MD   BP 122/72 mmHg  Pulse 68  Temp(Src) 98.4 F (36.9 C) (Oral)  Resp 19  Ht 6\' 2"  (1.88 m)  Wt  155 lb (70.308 kg)  BMI 19.89 kg/m2  SpO2 94% Physical Exam  Constitutional: He is oriented to person, place, and time. He appears well-developed and well-nourished. No distress.  Intraoral exam is normal. Nasal membranes slightly dry. No thrush. Pharynx benign.  HENT:  Head: Normocephalic.  Eyes: Conjunctivae are normal. Pupils are equal, round, and reactive to light. No scleral icterus.   Neck: Normal range of motion. Neck supple. No thyromegaly present.  Cardiovascular: Normal rate and regular rhythm.  Exam reveals no gallop and no friction rub.   No murmur heard. Is not tachycardic. Stable vital signs. Not hypoxemic  Pulmonary/Chest: Effort normal and breath sounds normal. No respiratory distress. He has no wheezes. He has no rales.  Abdominal: Soft. Bowel sounds are normal. He exhibits no distension. There is no tenderness. There is no rebound.  No focal tenderness. Planes of mild diffuse tenderness. Normal active bowel sounds.  Musculoskeletal: Normal range of motion.  Neurological: He is alert and oriented to person, place, and time.  Skin: Skin is warm and dry. No rash noted.  Psychiatric: He has a normal mood and affect. His behavior is normal.    ED Course  Procedures (including critical care time) Labs Review Labs Reviewed  COMPREHENSIVE METABOLIC PANEL - Abnormal; Notable for the following:    Glucose, Bld 105 (*)    Creatinine, Ser 1.26 (*)    Total Protein 9.0 (*)    AST 51 (*)    All other components within normal limits  LIPASE, BLOOD  CBC    Imaging Review No results found. I have personally reviewed and evaluated these images and lab results as part of my medical decision-making.   EKG Interpretation None      MDM   Final diagnoses:  Viral gastroenteritis    On recheck patient sleeping. He awakens easily. His abdomen is benign. He states his pain is resolved. He is no longer nauseated and is trying some by mouth liquids. Is getting a third liter of fluid as he has not yet had urine output. He is not tachycardic or hypotensive. He is tolerating by mouth plan will be home in treatment for probable viral gastroenteritis with Lomotil, Bentyl, and Zofran when necessary clear liquids. Advance your diet slowly.    Rolland PorterMark Meiling Hendriks, MD 08/14/15 [id number]  Rolland PorterMark Bowen Kia, MD 08/14/15 [phone]

## 2015-09-06 IMAGING — CT CT ABD-PELV W/ CM
1 of 2 series · 15 of 32 positions shown, 19 images · IV contrast (OMNIPAQUE 300)
Comparison: CT 05/22/2012

CLINICAL DATA: Abdominal pain and headache.

EXAM:
CT ABDOMEN AND PELVIS WITH CONTRAST
TECHNIQUE: Multidetector CT imaging of the abdomen and pelvis was performed
using the standard protocol following bolus administration of
intravenous contrast.
CONTRAST:  50mL OMNIPAQUE IOHEXOL 300 MG/ML SOLN, 100mL OMNIPAQUE
IOHEXOL 300 MG/ML SOLN

[Series 2: abd/pel with · axial · 0.74mm/px · z∈[-873,-488]mm · 15 of 85 slices shown, 19 images]
[im 4/85  soft-tissue]
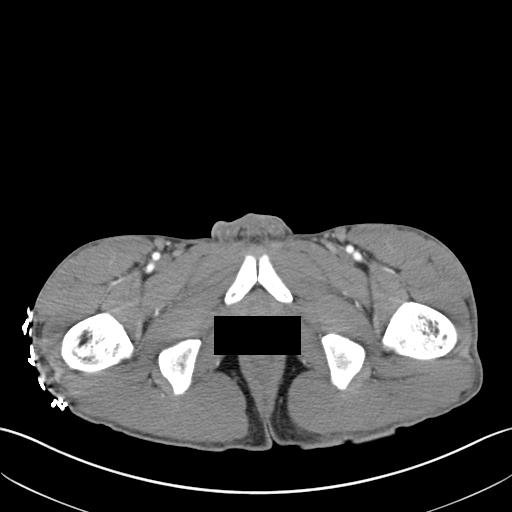
[im 4/85  bone]
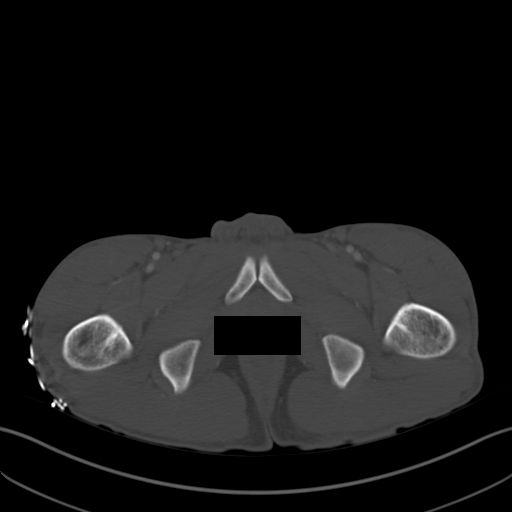
[im 11/85  soft-tissue]
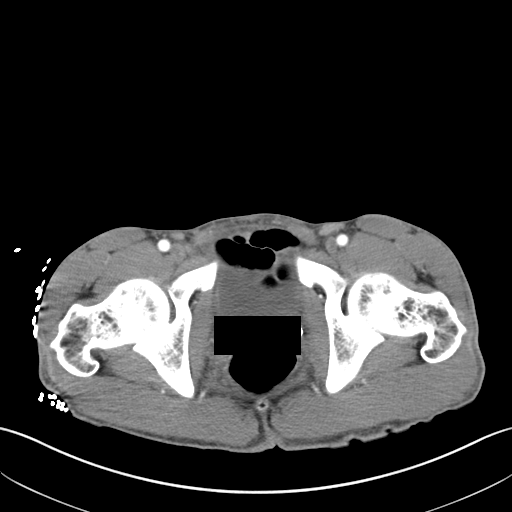
[im 18/85  soft-tissue]
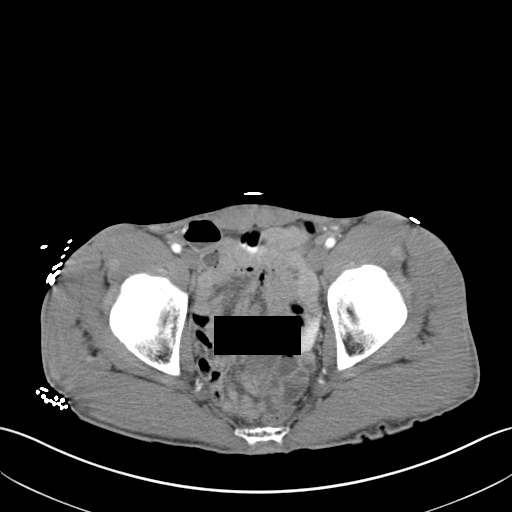
[im 25/85  soft-tissue]
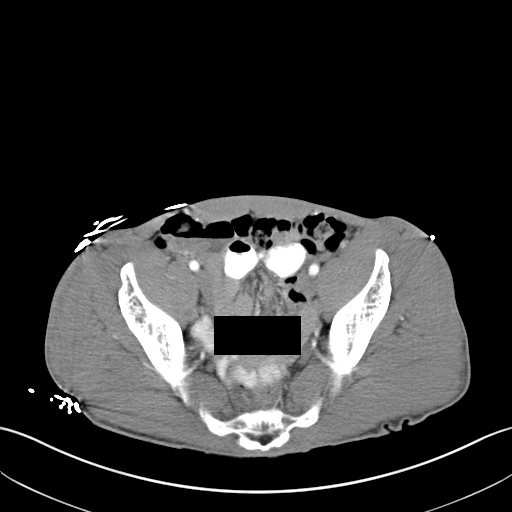
[im 29/85  soft-tissue]
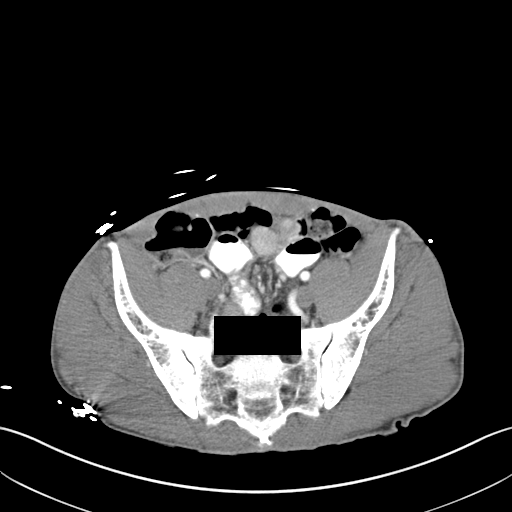
[im 36/85  soft-tissue]
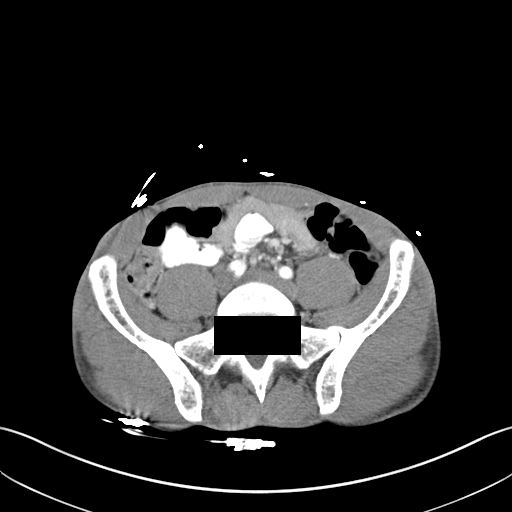
[im 43/85  soft-tissue]
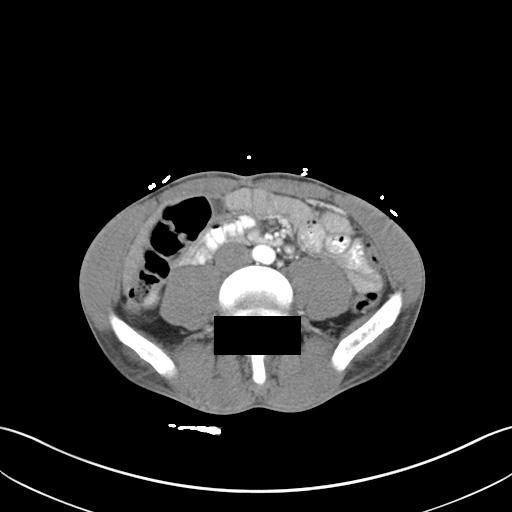
[im 50/85  soft-tissue]
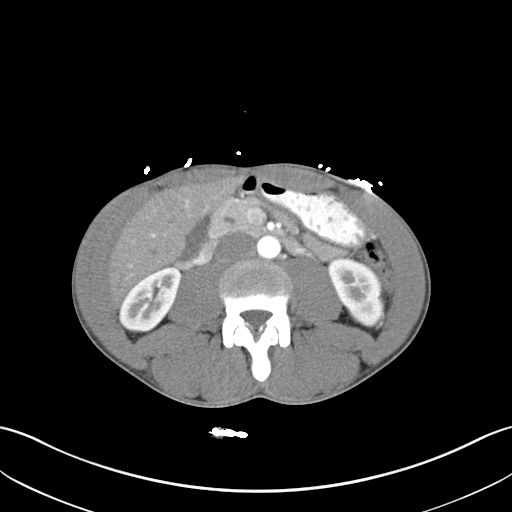
[im 57/85  soft-tissue]
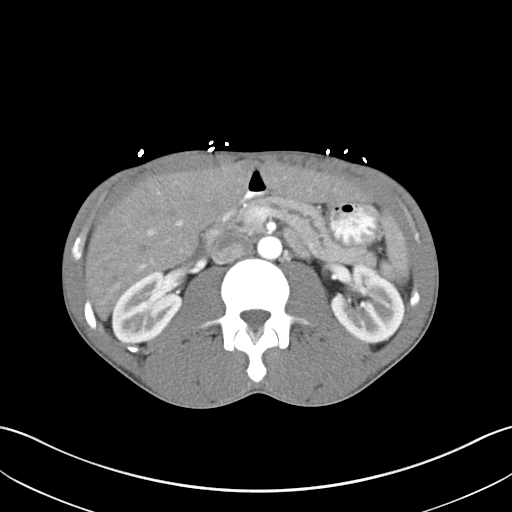
[im 57/85  bone]
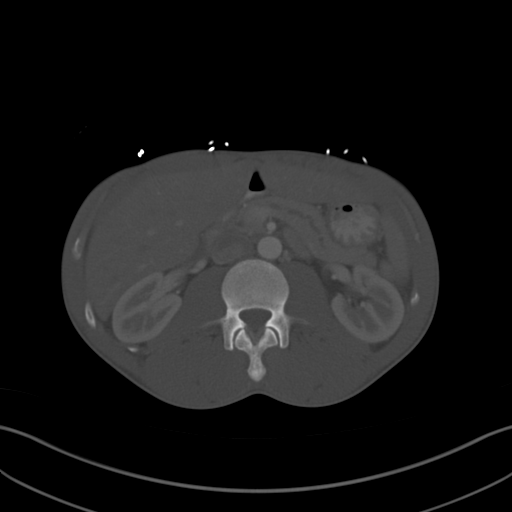
[im 60/85  soft-tissue]
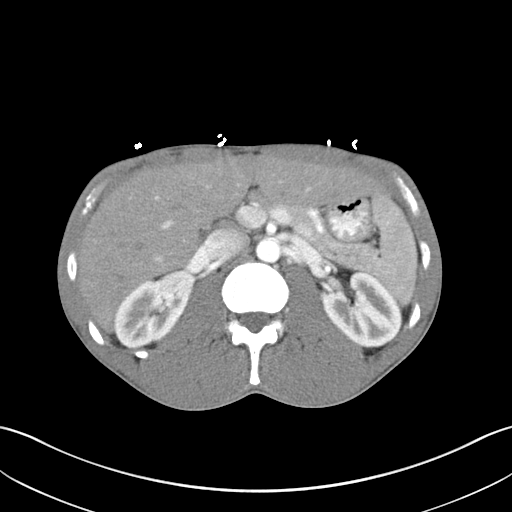
[im 67/85  soft-tissue]
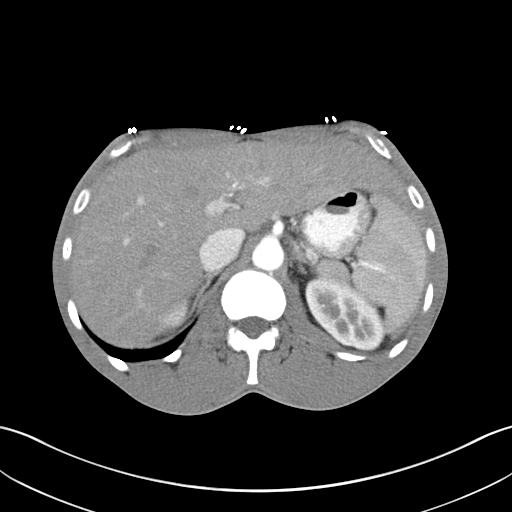
[im 71/85  lung]
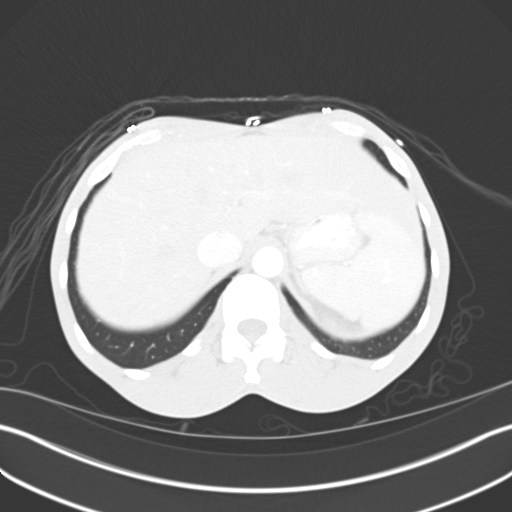
[im 74/85  soft-tissue]
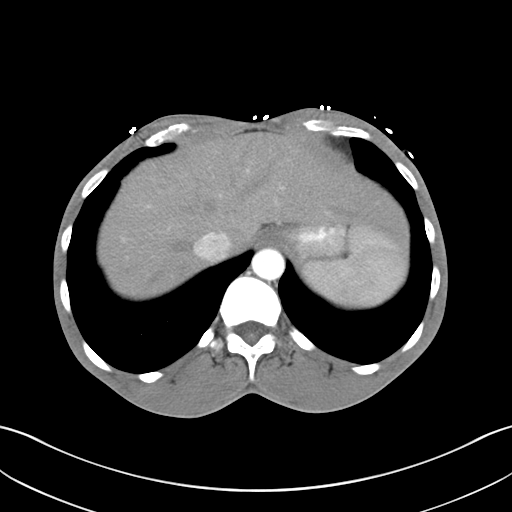
[im 74/85  lung]
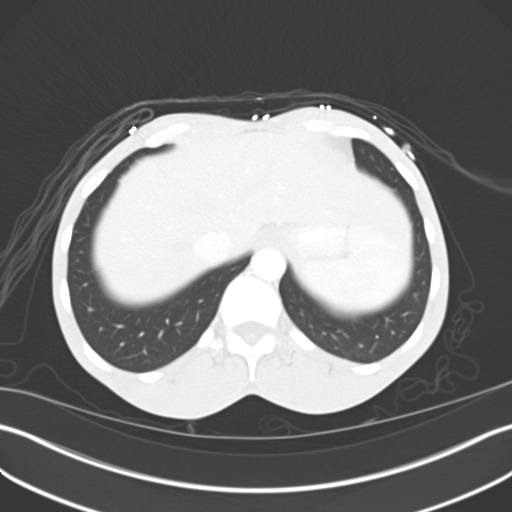
[im 78/85  lung]
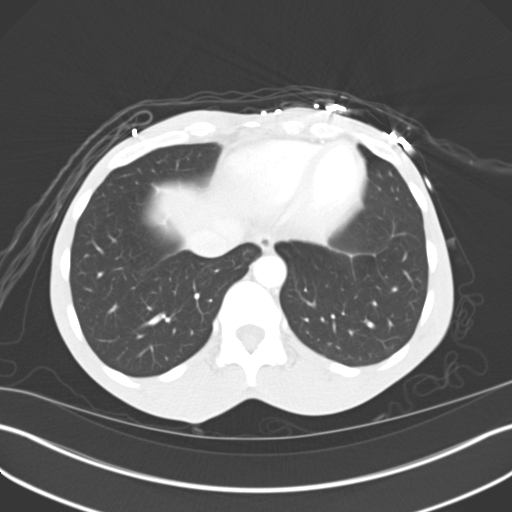
[im 81/85  soft-tissue]
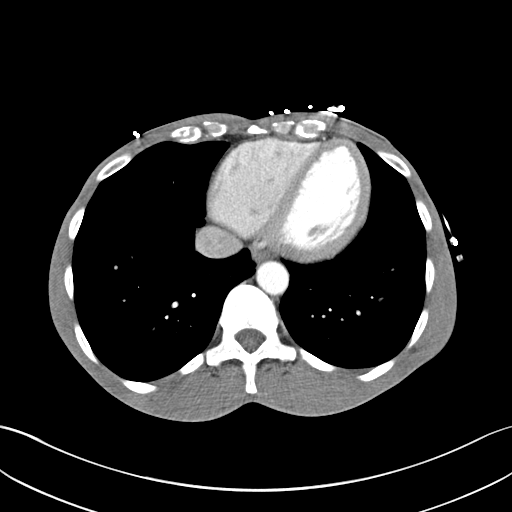
[im 81/85  lung]
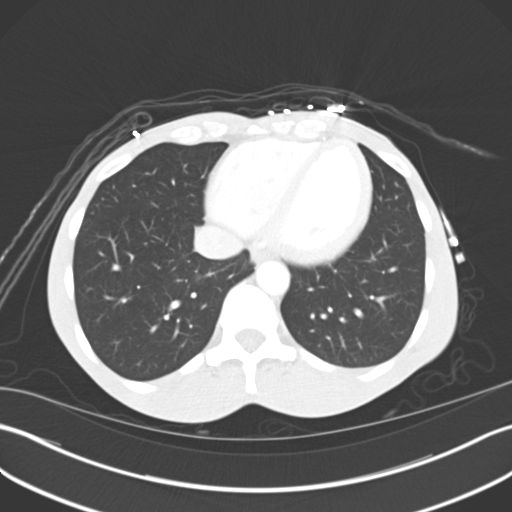

[15 of 32 positions shown; findings below may reference images not displayed]

FINDINGS: Lung bases are clear.  No pericardial fluid.

No focal hepatic lesion. The pancreatic duct is prominent and there
appears to be variant pancreatic ductal anatomy however the main
pancreatic duct joins the common bile duct at the ampulla.
Pancreatic duct measures 2 mm through the body and tail.

The spleen, adrenal glands, and kidneys are normal.

The stomach, small bowel, appendix, and cecum are normal. Appendix
not identified but there are no secondary signs appendicitis.

The stomach, the colon rectosigmoid colon are normal.

Abdominal or is normal caliber. No retroperitoneal periportal
lymphadenopathy.

No free fluid the pelvis. The prostate gland and bladder normal. No
pelvic lymphadenopathy. No aggressive osseous lesion.
IMPRESSION: 1. No acute findings in the abdomen pelvis.
2. Prominent pancreatic duct without evidence of biliary obstruction
or pancreatitis. In patient with elevated bilirubin, consider MRCP
for further evaluation.
3. Appendix not identified but there are no secondary signs of acute
appendicitis.

## 2015-09-06 IMAGING — CT CT HEAD W/O CM
2 series · 16 of 30 positions shown, 20 images · non-contrast
Comparison: None.

CLINICAL DATA: Headache.  No injury.  History of HIV.

EXAM:
CT HEAD WITHOUT CONTRAST
TECHNIQUE: Contiguous axial images were obtained from the base of the skull
through the vertex without intravenous contrast.

[Series 2: head w/o · axial · non-contrast · 0.46mm/px · z∈[-123,+2]mm · 13 of 31 slices shown, 17 images]
[im 3/31  brain]
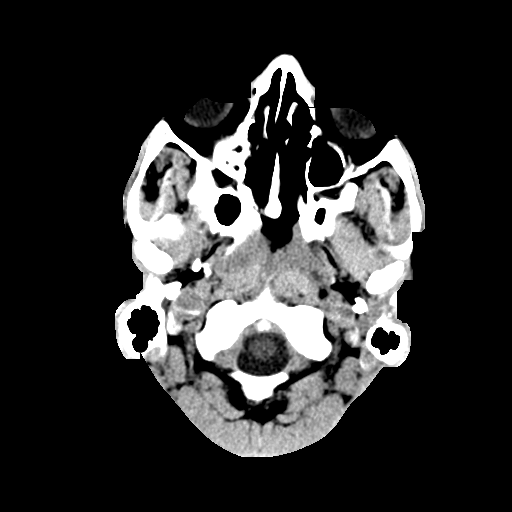
[im 3/31  bone]
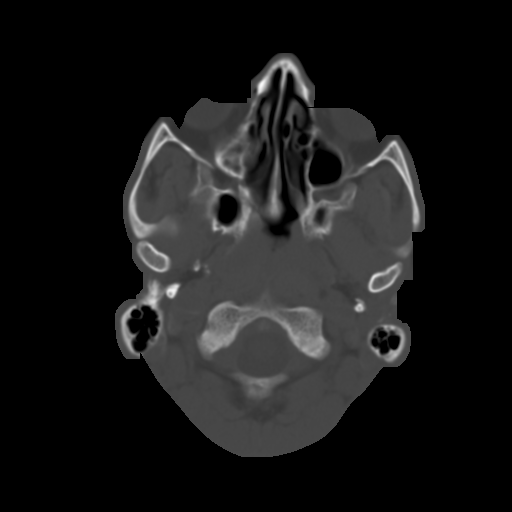
[im 5/31  brain]
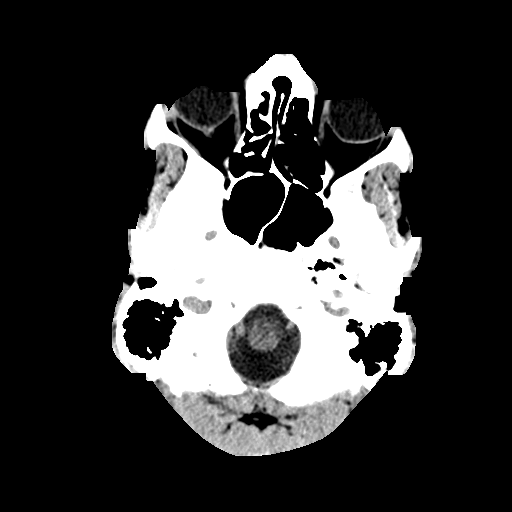
[im 7/31  brain]
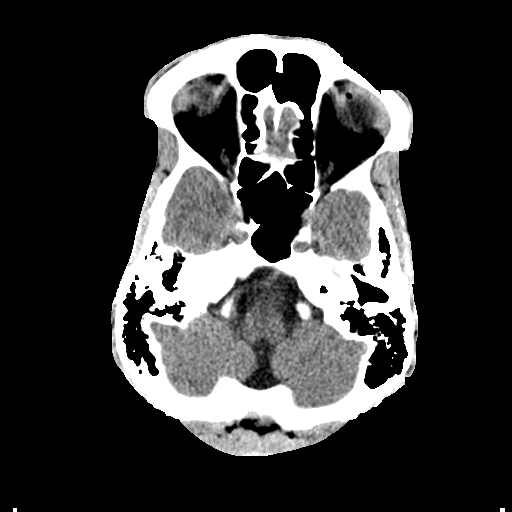
[im 9/31  brain]
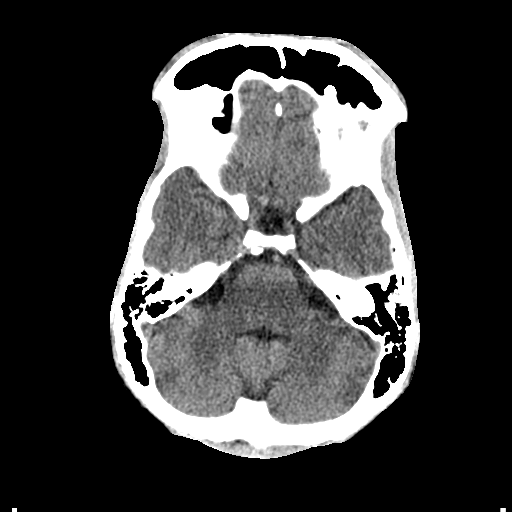
[im 11/31  brain]
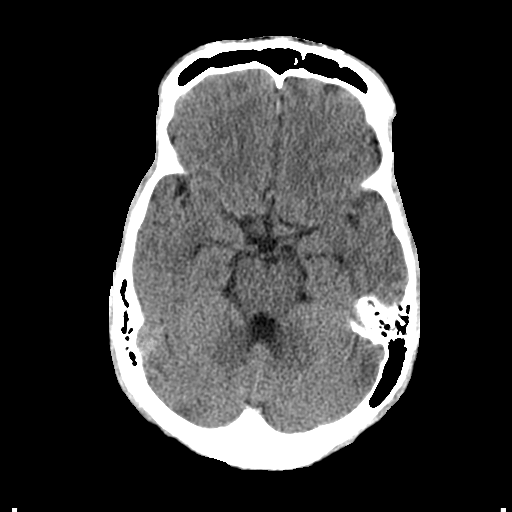
[im 11/31  bone]
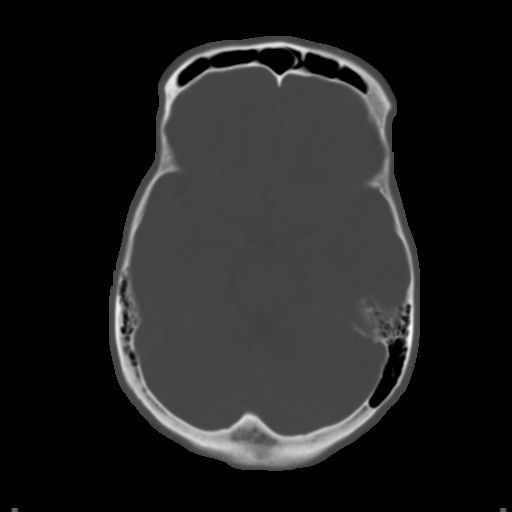
[im 13/31  brain]
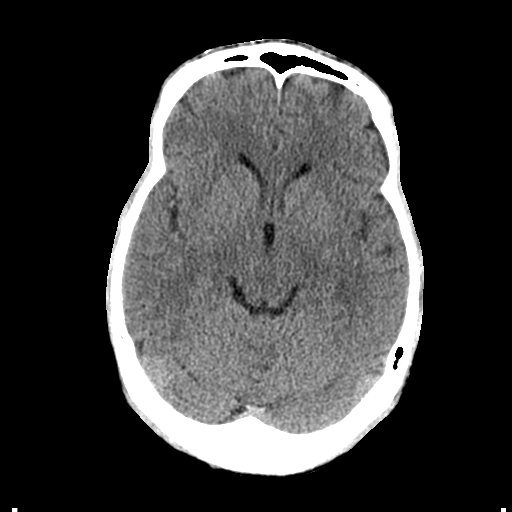
[im 16/31  brain]
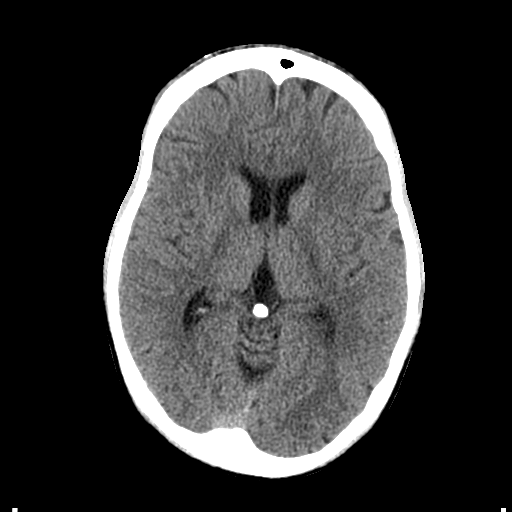
[im 18/31  brain]
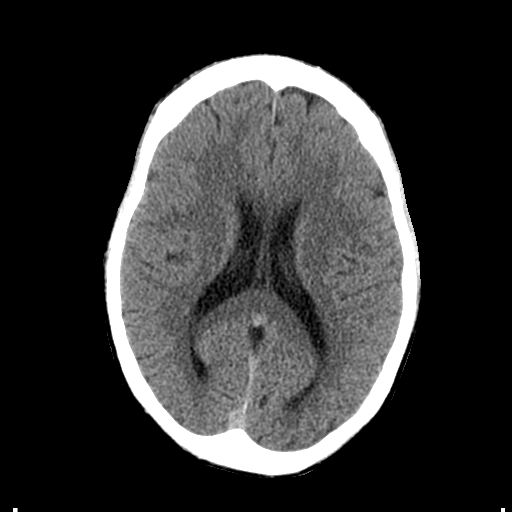
[im 20/31  brain]
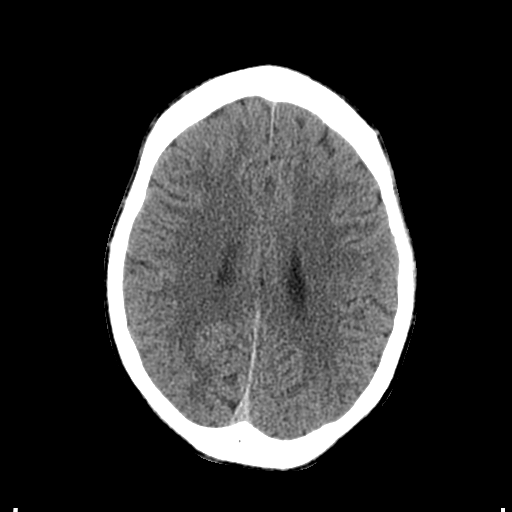
[im 20/31  bone]
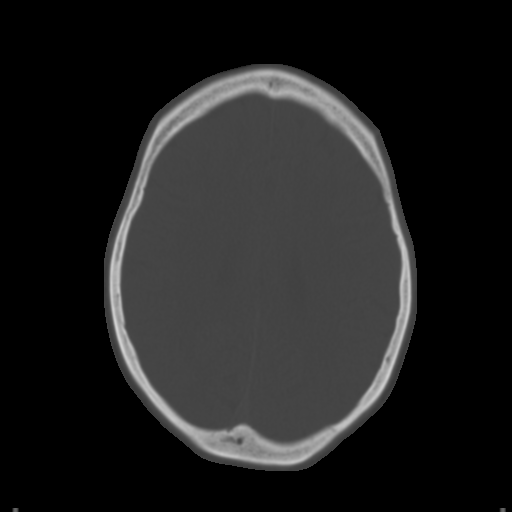
[im 22/31  brain]
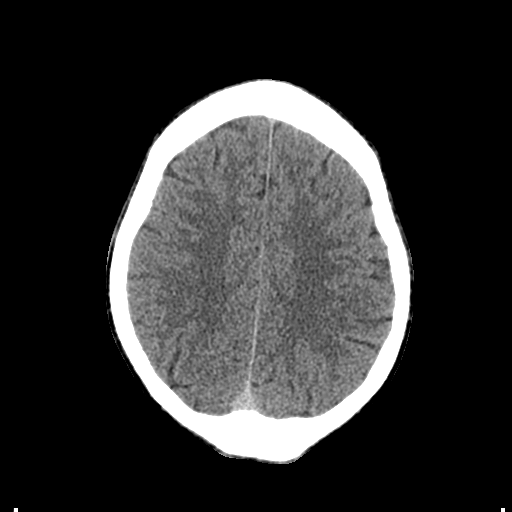
[im 24/31  brain]
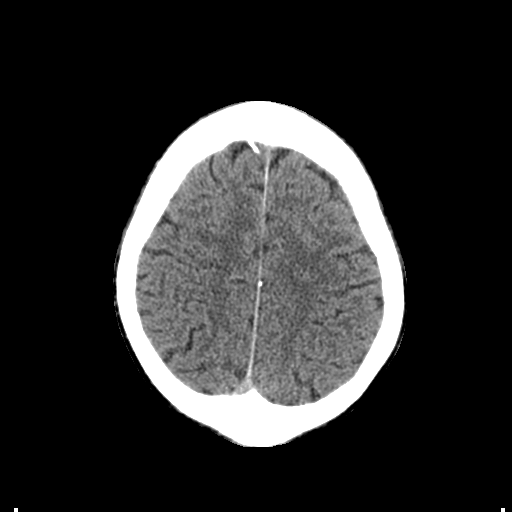
[im 26/31  brain]
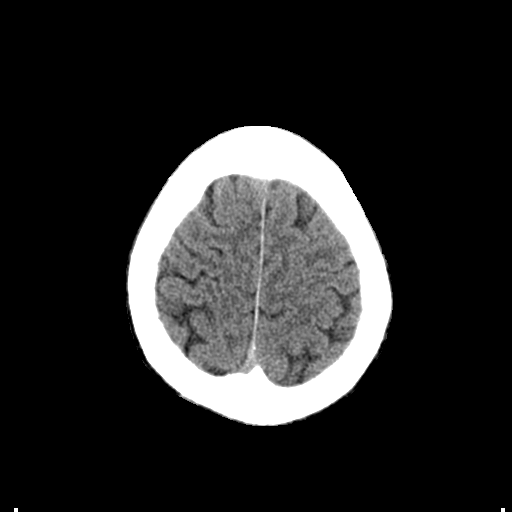
[im 28/31  brain]
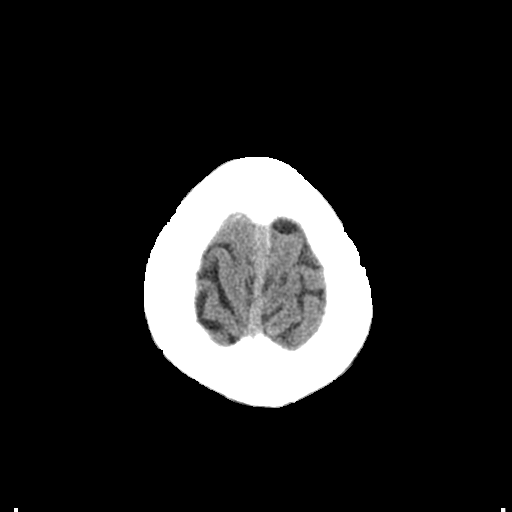
[im 28/31  bone]
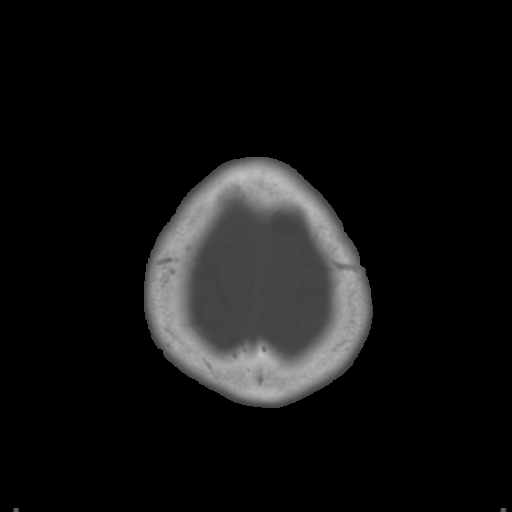

[Series 3: bone windows · axial · 0.46mm/px · z∈[-123,-83]mm · 3 of 31 slices shown]
[im 3/31  bone]
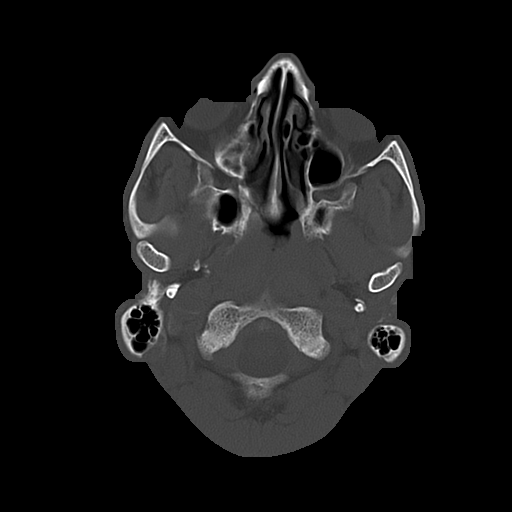
[im 7/31  bone]
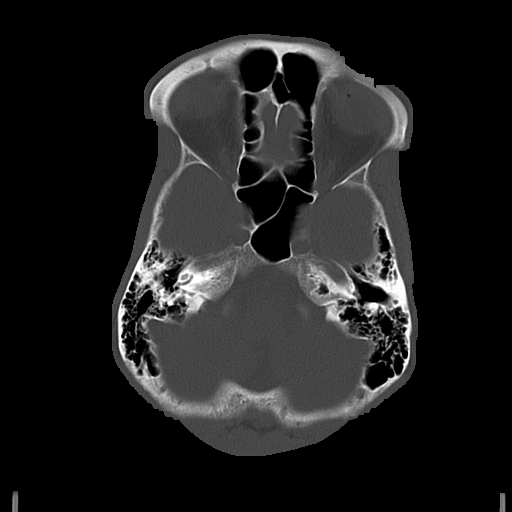
[im 11/31  bone]
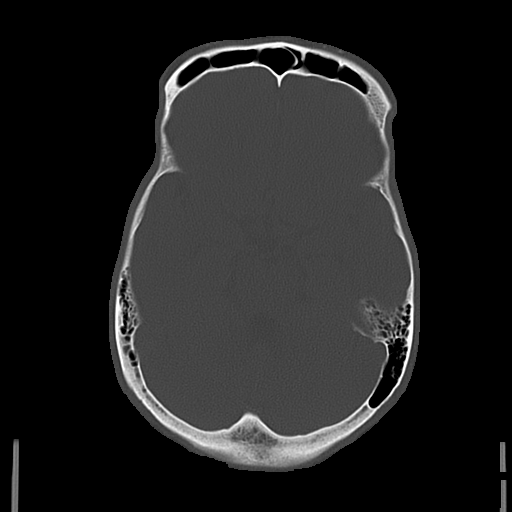

[16 of 30 positions shown; findings below may reference images not displayed]

FINDINGS: No mass lesion, mass effect, midline shift, hydrocephalus,
hemorrhage. No territorial ischemia or acute infarction. There is a
fluid level in the right maxillary sinus, which can be associated
with acute sinusitis in the appropriate clinical setting. Mastoid
air cells are clear. The other paranasal sinuses are within normal
limits. Left frontal sinus osteoma.
IMPRESSION: 1. Negative CT brain.
2. Fluid level in the right maxillary sinus which can be associated
with acute sinusitis in the appropriate clinical setting.

## 2016-10-10 ENCOUNTER — Emergency Department (HOSPITAL_COMMUNITY): Admission: EM | Admit: 2016-10-10 | Discharge: 2016-10-10 | Discharge status: Absent without leave | Payer: Self-pay

## 2021-12-12 ENCOUNTER — Other Ambulatory Visit: Payer: Self-pay

## 2021-12-12 ENCOUNTER — Emergency Department (HOSPITAL_COMMUNITY)
Admission: EM | Admit: 2021-12-12 | Discharge: 2021-12-12 | Disposition: A | Discharge status: Home / Self Care | Payer: No Typology Code available for payment source | Attending: Emergency Medicine | Admitting: Emergency Medicine

## 2021-12-12 ENCOUNTER — Encounter (HOSPITAL_COMMUNITY): Payer: Self-pay | Admitting: *Deleted

## 2021-12-12 ENCOUNTER — Emergency Department (HOSPITAL_COMMUNITY): Payer: No Typology Code available for payment source

## 2021-12-12 DIAGNOSIS — Z21 Asymptomatic human immunodeficiency virus [HIV] infection status: Secondary | ICD-10-CM | POA: Insufficient documentation

## 2021-12-12 DIAGNOSIS — S60415A Abrasion of left ring finger, initial encounter: Secondary | ICD-10-CM | POA: Insufficient documentation

## 2021-12-12 DIAGNOSIS — I10 Essential (primary) hypertension: Secondary | ICD-10-CM | POA: Diagnosis not present

## 2021-12-12 DIAGNOSIS — R079 Chest pain, unspecified: Secondary | ICD-10-CM | POA: Insufficient documentation

## 2021-12-12 DIAGNOSIS — Y92410 Unspecified street and highway as the place of occurrence of the external cause: Secondary | ICD-10-CM | POA: Diagnosis not present

## 2021-12-12 DIAGNOSIS — S6992XA Unspecified injury of left wrist, hand and finger(s), initial encounter: Secondary | ICD-10-CM | POA: Diagnosis present

## 2021-12-12 LAB — SAMPLE TO BLOOD BANK

## 2021-12-12 LAB — CBC
HCT: 33.9 % — ABNORMAL LOW (ref 39.0–52.0)
Hemoglobin: 11 g/dL — ABNORMAL LOW (ref 13.0–17.0)
MCH: 28.6 pg (ref 26.0–34.0)
MCHC: 32.4 g/dL (ref 30.0–36.0)
MCV: 88.1 fL (ref 80.0–100.0)
Platelets: 187 10*3/uL (ref 150–400)
RBC: 3.85 MIL/uL — ABNORMAL LOW (ref 4.22–5.81)
RDW: 11.2 % — ABNORMAL LOW (ref 11.5–15.5)
WBC: 4.9 10*3/uL (ref 4.0–10.5)
nRBC: 0 % (ref 0.0–0.2)

## 2021-12-12 LAB — ETHANOL: Alcohol, Ethyl (B): 93 mg/dL — ABNORMAL HIGH (ref <10)

## 2021-12-12 LAB — COMPREHENSIVE METABOLIC PANEL
ALT: 17 U/L (ref 0–44)
AST: 28 U/L (ref 15–41)
Albumin: 4 g/dL (ref 3.5–5.0)
Alkaline Phosphatase: 47 U/L (ref 38–126)
Anion gap: 11 (ref 5–15)
BUN: 7 mg/dL (ref 6–20)
CO2: 26 mmol/L (ref 22–32)
Calcium: 9.3 mg/dL (ref 8.9–10.3)
Chloride: 104 mmol/L (ref 98–111)
Creatinine, Ser: 1.33 mg/dL — ABNORMAL HIGH (ref 0.61–1.24)
GFR, Estimated: >60 mL/min (ref >60)
Glucose, Bld: 95 mg/dL (ref 70–99)
Potassium: 3 mmol/L — ABNORMAL LOW (ref 3.5–5.1)
Sodium: 141 mmol/L (ref 135–145)
Total Bilirubin: 0.6 mg/dL (ref 0.3–1.2)
Total Protein: 7 g/dL (ref 6.5–8.1)

## 2021-12-12 LAB — PROTIME-INR
INR: 1.1 (ref 0.8–1.2)
Prothrombin Time: 13.7 seconds (ref 11.4–15.2)

## 2021-12-12 LAB — I-STAT CHEM 8, ED
BUN: 6 mg/dL (ref 6–20)
Calcium, Ion: 1.14 mmol/L — ABNORMAL LOW (ref 1.15–1.40)
Chloride: 103 mmol/L (ref 98–111)
Creatinine, Ser: 1.3 mg/dL — ABNORMAL HIGH (ref 0.61–1.24)
Glucose, Bld: 88 mg/dL (ref 70–99)
HCT: 36 % — ABNORMAL LOW (ref 39.0–52.0)
Hemoglobin: 12.2 g/dL — ABNORMAL LOW (ref 13.0–17.0)
Potassium: 3 mmol/L — ABNORMAL LOW (ref 3.5–5.1)
Sodium: 141 mmol/L (ref 135–145)
TCO2: 25 mmol/L (ref 22–32)

## 2021-12-12 LAB — LACTIC ACID, PLASMA: Lactic Acid, Venous: 2 mmol/L (ref 0.5–1.9)

## 2021-12-12 MED ORDER — IOHEXOL 350 MG/ML SOLN
80.0000 mL | Freq: Once | INTRAVENOUS | Status: AC | PRN
  Administered 2021-12-12: 80 mL via INTRAVENOUS

## 2021-12-12 MED ORDER — METHOCARBAMOL 500 MG PO TABS: 500.0000 mg | ORAL_TABLET | Freq: Three times a day (TID) | ORAL | 0 refills | Status: DC | PRN

## 2021-12-12 MED ORDER — METHOCARBAMOL 500 MG PO TABS
1000.0000 mg | ORAL_TABLET | Freq: Once | ORAL | Status: AC
  Administered 2021-12-12: 1000 mg via ORAL
  Filled 2021-12-12: qty 2

## 2021-12-12 NOTE — Discharge Instructions (Addendum)
You were evaluated in the Emergency Department and after careful evaluation, we did not find any emergent condition requiring admission or further testing in the hospital.  Your exam/testing today was overall reassuring.  CT scans did not show any significant injuries.  Recommend Tylenol 1000 mg every 4-6 hours for pain.  Use the Robaxin muscle relaxer for more significant pain.  Please return to the Emergency Department if you experience any worsening of your condition.  Thank you for allowing [place] to be a part of your care.
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
## 2022-11-12 ENCOUNTER — Emergency Department (HOSPITAL_COMMUNITY): Payer: Self-pay

## 2022-11-12 ENCOUNTER — Inpatient Hospital Stay (HOSPITAL_COMMUNITY)
Admission: EM | Admit: 2022-11-12 | Discharge: 2022-11-14 | DRG: 372 | Disposition: A | Discharge status: Home / Self Care | Payer: Self-pay | Attending: Internal Medicine | Admitting: Internal Medicine

## 2022-11-12 ENCOUNTER — Encounter (HOSPITAL_COMMUNITY): Payer: Self-pay

## 2022-11-12 ENCOUNTER — Other Ambulatory Visit: Payer: Self-pay

## 2022-11-12 DIAGNOSIS — A042 Enteroinvasive Escherichia coli infection: Principal | ICD-10-CM | POA: Diagnosis present

## 2022-11-12 DIAGNOSIS — R109 Unspecified abdominal pain: Secondary | ICD-10-CM | POA: Insufficient documentation

## 2022-11-12 DIAGNOSIS — Z809 Family history of malignant neoplasm, unspecified: Secondary | ICD-10-CM

## 2022-11-12 DIAGNOSIS — D638 Anemia in other chronic diseases classified elsewhere: Secondary | ICD-10-CM | POA: Diagnosis present

## 2022-11-12 DIAGNOSIS — Z882 Allergy status to sulfonamides status: Secondary | ICD-10-CM

## 2022-11-12 DIAGNOSIS — E872 Acidosis, unspecified: Secondary | ICD-10-CM | POA: Diagnosis present

## 2022-11-12 DIAGNOSIS — F1210 Cannabis abuse, uncomplicated: Secondary | ICD-10-CM | POA: Diagnosis present

## 2022-11-12 DIAGNOSIS — Z21 Asymptomatic human immunodeficiency virus [HIV] infection status: Secondary | ICD-10-CM | POA: Diagnosis present

## 2022-11-12 DIAGNOSIS — R112 Nausea with vomiting, unspecified: Secondary | ICD-10-CM | POA: Diagnosis present

## 2022-11-12 DIAGNOSIS — D75A Glucose-6-phosphate dehydrogenase (G6PD) deficiency without anemia: Secondary | ICD-10-CM | POA: Diagnosis present

## 2022-11-12 DIAGNOSIS — Z1152 Encounter for screening for COVID-19: Secondary | ICD-10-CM

## 2022-11-12 DIAGNOSIS — K529 Noninfective gastroenteritis and colitis, unspecified: Principal | ICD-10-CM | POA: Diagnosis present

## 2022-11-12 DIAGNOSIS — F1010 Alcohol abuse, uncomplicated: Secondary | ICD-10-CM | POA: Diagnosis present

## 2022-11-12 DIAGNOSIS — I10 Essential (primary) hypertension: Secondary | ICD-10-CM | POA: Diagnosis present

## 2022-11-12 DIAGNOSIS — Z79899 Other long term (current) drug therapy: Secondary | ICD-10-CM

## 2022-11-12 DIAGNOSIS — Z8249 Family history of ischemic heart disease and other diseases of the circulatory system: Secondary | ICD-10-CM

## 2022-11-12 DIAGNOSIS — A419 Sepsis, unspecified organism: Secondary | ICD-10-CM | POA: Diagnosis present

## 2022-11-12 DIAGNOSIS — E039 Hypothyroidism, unspecified: Secondary | ICD-10-CM | POA: Diagnosis present

## 2022-11-12 DIAGNOSIS — F1910 Other psychoactive substance abuse, uncomplicated: Secondary | ICD-10-CM | POA: Diagnosis present

## 2022-11-12 DIAGNOSIS — Z833 Family history of diabetes mellitus: Secondary | ICD-10-CM

## 2022-11-12 DIAGNOSIS — B20 Human immunodeficiency virus [HIV] disease: Secondary | ICD-10-CM | POA: Diagnosis present

## 2022-11-12 DIAGNOSIS — A039 Shigellosis, unspecified: Secondary | ICD-10-CM | POA: Diagnosis present

## 2022-11-12 DIAGNOSIS — E0590 Thyrotoxicosis, unspecified without thyrotoxic crisis or storm: Secondary | ICD-10-CM | POA: Diagnosis present

## 2022-11-12 DIAGNOSIS — E0500 Thyrotoxicosis with diffuse goiter without thyrotoxic crisis or storm: Secondary | ICD-10-CM | POA: Diagnosis present

## 2022-11-12 LAB — URINALYSIS, ROUTINE W REFLEX MICROSCOPIC
Bilirubin Urine: NEGATIVE
Glucose, UA: NEGATIVE mg/dL
Hgb urine dipstick: NEGATIVE
Ketones, ur: 20 mg/dL — AB
Leukocytes,Ua: NEGATIVE
Nitrite: NEGATIVE
Protein, ur: NEGATIVE mg/dL
Specific Gravity, Urine: 1.031 — ABNORMAL HIGH (ref 1.005–1.030)
pH: 5 (ref 5.0–8.0)

## 2022-11-12 LAB — CBC
HCT: 37.4 % — ABNORMAL LOW (ref 39.0–52.0)
Hemoglobin: 11.5 g/dL — ABNORMAL LOW (ref 13.0–17.0)
MCH: 27.1 pg (ref 26.0–34.0)
MCHC: 30.7 g/dL (ref 30.0–36.0)
MCV: 88 fL (ref 80.0–100.0)
Platelets: 160 10*3/uL (ref 150–400)
RBC: 4.25 MIL/uL (ref 4.22–5.81)
RDW: 11.8 % (ref 11.5–15.5)
WBC: 11.6 10*3/uL — ABNORMAL HIGH (ref 4.0–10.5)
nRBC: 0 % (ref 0.0–0.2)

## 2022-11-12 LAB — RESP PANEL BY RT-PCR (RSV, FLU A&B, COVID)  RVPGX2
Influenza A by PCR: NEGATIVE
Influenza B by PCR: NEGATIVE
Resp Syncytial Virus by PCR: NEGATIVE
SARS Coronavirus 2 by RT PCR: NEGATIVE

## 2022-11-12 LAB — COMPREHENSIVE METABOLIC PANEL
ALT: 23 U/L (ref 0–44)
AST: 37 U/L (ref 15–41)
Albumin: 4.6 g/dL (ref 3.5–5.0)
Alkaline Phosphatase: 47 U/L (ref 38–126)
Anion gap: 11 (ref 5–15)
BUN: 10 mg/dL (ref 6–20)
CO2: 24 mmol/L (ref 22–32)
Calcium: 9.1 mg/dL (ref 8.9–10.3)
Chloride: 103 mmol/L (ref 98–111)
Creatinine, Ser: 0.95 mg/dL (ref 0.61–1.24)
GFR, Estimated: >60 mL/min (ref >60)
Glucose, Bld: 104 mg/dL — ABNORMAL HIGH (ref 70–99)
Potassium: 3.7 mmol/L (ref 3.5–5.1)
Sodium: 138 mmol/L (ref 135–145)
Total Bilirubin: 1.1 mg/dL (ref 0.3–1.2)
Total Protein: 8.4 g/dL — ABNORMAL HIGH (ref 6.5–8.1)

## 2022-11-12 LAB — PROTIME-INR
INR: 1 (ref 0.8–1.2)
Prothrombin Time: 13.6 s (ref 11.4–15.2)

## 2022-11-12 LAB — I-STAT CG4 LACTIC ACID, ED
Lactic Acid, Venous: 2 mmol/L (ref 0.5–1.9)
Lactic Acid, Venous: 1.2 mmol/L (ref 0.5–1.9)

## 2022-11-12 LAB — LIPASE, BLOOD: Lipase: 22 U/L (ref 11–51)

## 2022-11-12 LAB — APTT: aPTT: 25 s (ref 24–36)

## 2022-11-12 MED ORDER — AMOXICILLIN-POT CLAVULANATE 875-125 MG PO TABS
1.0000 | ORAL_TABLET | Freq: Two times a day (BID) | ORAL | 0 refills | Status: DC
Start: 2022-11-12 — End: 2022-11-19

## 2022-11-12 MED ORDER — ONDANSETRON HCL 4 MG/2ML IJ SOLN
4.0000 mg | Freq: Once | INTRAMUSCULAR | Status: AC | PRN
  Administered 2022-11-12: 4 mg via INTRAVENOUS
  Filled 2022-11-12: qty 2

## 2022-11-12 MED ORDER — PIPERACILLIN-TAZOBACTAM 3.375 G IVPB 30 MIN
3.3750 g | Freq: Once | INTRAVENOUS | Status: AC
  Administered 2022-11-12: 3.375 g via INTRAVENOUS
  Filled 2022-11-12: qty 50

## 2022-11-12 MED ORDER — SODIUM CHLORIDE 0.9 % IV BOLUS
1000.0000 mL | Freq: Once | INTRAVENOUS | Status: AC
  Administered 2022-11-12: 1000 mL via INTRAVENOUS

## 2022-11-12 MED ORDER — IOHEXOL 300 MG/ML  SOLN
100.0000 mL | Freq: Once | INTRAMUSCULAR | Status: AC | PRN
  Administered 2022-11-12: 100 mL via INTRAVENOUS

## 2022-11-12 MED ORDER — ACETAMINOPHEN 325 MG PO TABS: 650.0000 mg | ORAL_TABLET | Freq: Four times a day (QID) | ORAL | Status: DC | PRN

## 2022-11-12 MED ORDER — MORPHINE SULFATE (PF) 2 MG/ML IV SOLN
2.0000 mg | Freq: Once | INTRAVENOUS | Status: AC
  Administered 2022-11-12: 2 mg via INTRAVENOUS
  Filled 2022-11-12: qty 1

## 2022-11-12 MED ORDER — ACETAMINOPHEN 650 MG RE SUPP: 650.0000 mg | Freq: Four times a day (QID) | RECTAL | Status: DC | PRN

## 2022-11-12 MED ORDER — SODIUM CHLORIDE 0.9% FLUSH: 3.0000 mL | INTRAVENOUS | Status: DC | PRN

## 2022-11-12 MED ORDER — ACETAMINOPHEN 325 MG PO TABS
650.0000 mg | ORAL_TABLET | Freq: Once | ORAL | Status: AC
  Administered 2022-11-12: 650 mg via ORAL
  Filled 2022-11-12: qty 2

## 2022-11-12 MED ORDER — BICTEGRAVIR-EMTRICITAB-TENOFOV 50-200-25 MG PO TABS
1.0000 | ORAL_TABLET | Freq: Every day | ORAL | Status: DC
  Administered 2022-11-12 – 2022-11-14 (×3): 1 via ORAL
  Filled 2022-11-12 (×5): qty 1

## 2022-11-12 MED ORDER — AMOXICILLIN-POT CLAVULANATE 875-125 MG PO TABS
1.0000 | ORAL_TABLET | Freq: Once | ORAL | Status: AC
  Administered 2022-11-12: 1 via ORAL
  Filled 2022-11-12: qty 1

## 2022-11-12 MED ORDER — ONDANSETRON HCL 4 MG PO TABS: 4.0000 mg | ORAL_TABLET | Freq: Four times a day (QID) | ORAL | Status: DC | PRN

## 2022-11-12 MED ORDER — MORPHINE SULFATE (PF) 2 MG/ML IV SOLN
1.0000 mg | INTRAVENOUS | Status: DC | PRN
  Administered 2022-11-12 – 2022-11-13 (×3): 1 mg via INTRAVENOUS
  Filled 2022-11-12 (×3): qty 1

## 2022-11-12 MED ORDER — SODIUM CHLORIDE 0.9 % IV SOLN: 250.0000 mL | INTRAVENOUS | Status: DC | PRN

## 2022-11-12 MED ORDER — SODIUM CHLORIDE 0.9% FLUSH
3.0000 mL | Freq: Two times a day (BID) | INTRAVENOUS | Status: DC
  Administered 2022-11-13 – 2022-11-14 (×2): 3 mL via INTRAVENOUS

## 2022-11-12 MED ORDER — PIPERACILLIN-TAZOBACTAM 3.375 G IVPB
3.3750 g | Freq: Three times a day (TID) | INTRAVENOUS | Status: DC
  Administered 2022-11-13 – 2022-11-14 (×4): 3.375 g via INTRAVENOUS
  Filled 2022-11-12 (×4): qty 50

## 2022-11-12 MED ORDER — LACTATED RINGERS IV SOLN: INTRAVENOUS | Status: AC

## 2022-11-12 MED ORDER — ONDANSETRON HCL 4 MG/2ML IJ SOLN
4.0000 mg | Freq: Four times a day (QID) | INTRAMUSCULAR | Status: DC | PRN
  Administered 2022-11-12 – 2022-11-13 (×2): 4 mg via INTRAVENOUS
  Filled 2022-11-12 (×2): qty 2

## 2022-11-12 MED ORDER — ENOXAPARIN SODIUM 40 MG/0.4ML IJ SOSY
40.0000 mg | PREFILLED_SYRINGE | INTRAMUSCULAR | Status: DC
  Filled 2022-11-12: qty 0.4

## 2022-11-12 MED ORDER — ONDANSETRON HCL 4 MG/2ML IJ SOLN
4.0000 mg | Freq: Once | INTRAMUSCULAR | Status: AC
  Administered 2022-11-12: 4 mg via INTRAVENOUS
  Filled 2022-11-12: qty 2

## 2022-11-12 MED ORDER — SODIUM CHLORIDE 0.9% FLUSH
3.0000 mL | Freq: Two times a day (BID) | INTRAVENOUS | Status: DC
  Administered 2022-11-13: 3 mL via INTRAVENOUS

## 2022-11-12 NOTE — ED Provider Notes (Signed)
  Physical Exam  BP 132/68   Pulse 61   Temp (!) 100.9 F (38.3 C) (Oral)   Resp 14   Ht 6\' 2"  (1.88 m)   Wt 72.6 kg   SpO2 98%   BMI 20.55 kg/m   Physical Exam  Procedures  Procedures  ED Course / MDM   Clinical Course as of 11/12/22 1858  Wynelle Link Nov 12, 2022  1223 Febrile.  No tachycardia or tachypnea.  Does not currently meet sepsis criteria.  Awaiting white count. [BM]  1439 Augmentin [BM]  1442 Patient reassessed, resting comfortably bed no acute distress. [BM]    Clinical Course User Index [BM] Bill Salinas, PA-C   Medical Decision Making Amount and/or Complexity of Data Reviewed Labs: ordered. Radiology: ordered. ECG/medicine tests: ordered.  Risk OTC drugs. Prescription drug management.   Patient care taken over from [place], [place], at shift change.  Patient's disposition was pending second lactic acid and completion of fluids.  Second lactic was normal.  Patient started to feel lightheaded and generalized weakness prior to discharge.  No focal neurodeficits noted on exam.  I ordered another liter of fluids at that time.  On reevaluation, patient reported improvement of symptoms.  After fluids, patient reported that his pain and vomiting returned.  I consulted the hospitalist and discussed findings with them. They agreed to admit patient for further management of symptoms.   Maxwell Marion, PA-C 11/12/22 2109    Charlynne Pander, MD 11/12/22 [phone]

## 2022-11-12 NOTE — Discharge Instructions (Signed)
At this time there does not appear to be the presence of an emergent medical condition, however there is always the potential for conditions to change. Please read and follow the below instructions.  Please return to the Emergency Department immediately for any new or worsening symptoms. Please be sure to follow up with your Primary Care Provider within one week regarding your visit today; please call their office to schedule an appointment even if you are feeling better for a follow-up visit. You may take the antibiotic Augmentin as prescribed to help with possible infectious cause of your symptoms.  Please drink plenty of water to avoid dehydration and get plenty of rest.  Please see your primary care provider this week for recheck.  Please return immediately to the Emergency Department for any new or worsening of symptoms.   Please read the additional information packets attached to your discharge summary.  Go to the nearest Emergency Department immediately if: You have fever or chills You have chest pain. Your heart is beating very quickly. You have trouble breathing or you are breathing very quickly. You feel very weak or you faint. You have any new/concerning or worsening of symptoms.  Do not take your medicine if  develop an itchy rash, swelling in your mouth or lips, or difficulty breathing; call 911 and seek immediate emergency medical attention if this occurs.  You may review your lab tests and imaging results in their entirety on your MyChart account.  Please discuss all results of fully with your primary care provider and other specialist at your follow-up visit.  Note: Portions of this text may have been transcribed using voice recognition software. Every effort was made to ensure accuracy; however, inadvertent computerized transcription errors may still be present.

## 2022-11-12 NOTE — ED Notes (Signed)
Patient doesn't feel he is ready to go    message was sent to Dr Silverio Lay

## 2022-11-12 NOTE — [facility] Notes (Signed)
PHARMACY NOTE   Pharmacy has been consulted to assist with dosing of Zosyn for intra-abdominal infection.  Zosyn 3.375g IV q8h (each dose infused over 4 hours)  Need for further dosage adjustment appears unlikely at present, so pharmacy will sign off at this time.  Please reconsult if a change in clinical status warrants re-evaluation of dosage.   Greer Pickerel, PharmD, BCPS Clinical Pharmacist 11/12/2022 9:32 PM

## 2022-11-12 NOTE — ED Notes (Signed)
.ED TO INPATIENT HANDOFF REPORT  ED Nurse Name and Phone #: Lyndel Safe Name/Age/Gender Steve Hernandez 47 y.o. male Room/Bed: WA01/WA01  Code Status   Code Status: Full Code  Home/SNF/Other Home Patient oriented to: self, place, time, and situation Is this baseline? Yes   Triage Complete: Triage complete  Chief Complaint Sepsis [facility]) [A41.9]  Triage Note Patient is here for evaluation of abdominal pain and vomiting that started at 0400 today. Pt reports eating seafood last night and then got sick this morning.   Allergies Allergies  Allergen Reactions   Sulfamethoxazole-Trimethoprim Hives   Primaquine Other (See Comments)    G6PD deficiency    Level of Care/Admitting Diagnosis ED Disposition     ED Disposition  Admit   Condition  --   Comment  Hospital Area: [facility] [[id number]]  Level of Care: Med-Surg [16]  May admit patient to [facility] or [facility] if equivalent level of care is available:: No  Covid Evaluation: Confirmed COVID Negative  Diagnosis: Sepsis [facility]) [[id number]]  Admitting Physician: Tereasa Coop [[id number]]  Attending Physician: Tereasa Coop [[id number]]  Certification:: I certify this patient will need inpatient services for at least 2 midnights  Expected Medical Readiness: 11/16/2022          B Medical/Surgery History Past Medical History:  Diagnosis Date   HIV (human immunodeficiency virus infection) (HCC)    Hypertension    Hyperthyroidism    History reviewed. No pertinent surgical history.   A IV Location/Drains/Wounds Patient Lines/Drains/Airways Status     Active Line/Drains/Airways     Name Placement date Placement time Site Days   Peripheral IV 11/12/22 20 G Right Antecubital 11/12/22  1151  Antecubital  less than 1            Intake/Output Last 24 hours No intake or output data in the 24 hours ending 11/12/22 2240  Labs/Imaging Results for orders placed or performed during the  hospital encounter of 11/12/22 (from the past 48 hour(s))  Lipase, blood     Status: None   Collection Time: 11/12/22 12:00 PM  Result Value Ref Range   Lipase 22 11 - 51 U/L    Comment: Performed at [facility], [address], [place], [place] [address]  Comprehensive metabolic panel     Status: Abnormal   Collection Time: 11/12/22 12:00 PM  Result Value Ref Range   Sodium 138 135 - 145 mmol/L   Potassium 3.7 3.5 - 5.1 mmol/L   Chloride 103 98 - 111 mmol/L   CO2 24 22 - 32 mmol/L   Glucose, Bld 104 (H) 70 - 99 mg/dL    Comment: Glucose reference range applies only to samples taken after fasting for at least 8 hours.   BUN 10 6 - 20 mg/dL   Creatinine, Ser 3.08 0.61 - 1.24 mg/dL   Calcium 9.1 8.9 - 65.7 mg/dL   Total Protein 8.4 (H) 6.5 - 8.1 g/dL   Albumin 4.6 3.5 - 5.0 g/dL   AST 37 15 - 41 U/L   ALT 23 0 - 44 U/L   Alkaline Phosphatase 47 38 - 126 U/L   Total Bilirubin 1.1 0.3 - 1.2 mg/dL   GFR, Estimated >84 >69 mL/min    Comment: (NOTE) Calculated using the CKD-EPI Creatinine Equation (2021)    Anion gap 11 5 - 15    Comment: Performed at [facility], [address] [place], [place] [address]  CBC  Status: Abnormal   Collection Time: 11/12/22 12:00 PM  Result Value Ref Range   WBC 11.6 (H) 4.0 - 10.5 K/uL    Comment: WHITE COUNT CONFIRMED ON SMEAR   RBC 4.25 4.22 - 5.81 MIL/uL   Hemoglobin 11.5 (L) 13.0 - 17.0 g/dL   HCT 16.1 (L) 09.6 - 04.5 %   MCV 88.0 80.0 - 100.0 fL   MCH 27.1 26.0 - 34.0 pg   MCHC 30.7 30.0 - 36.0 g/dL   RDW 40.9 81.1 - 91.4 %   Platelets 160 150 - 400 K/uL   nRBC 0.0 0.0 - 0.2 %    Comment: Performed at [facility], [address] [place], [place] [address]  Resp panel by RT-PCR (RSV, Flu A&B, Covid) Anterior Nasal Swab     Status: None   Collection Time: 11/12/22  1:16 PM   Specimen: Anterior Nasal Swab  Result Value Ref Range   SARS Coronavirus 2 by RT PCR NEGATIVE NEGATIVE     Comment: (NOTE) SARS-CoV-2 target nucleic acids are NOT DETECTED.  The SARS-CoV-2 RNA is generally detectable in upper respiratory specimens during the acute phase of infection. The lowest concentration of SARS-CoV-2 viral copies this assay can detect is 138 copies/mL. A negative result does not preclude SARS-Cov-2 infection and should not be used as the sole basis for treatment or other patient management decisions. A negative result may occur with  improper specimen collection/handling, submission of specimen other than nasopharyngeal swab, presence of viral mutation(s) within the areas targeted by this assay, and inadequate number of viral copies(<138 copies/mL). A negative result must be combined with clinical observations, patient history, and epidemiological information. The expected result is Negative.  Fact Sheet for Patients:  BloggerCourse.com  Fact Sheet for Healthcare Providers:  SeriousBroker.it  This test is no t yet approved or cleared by the [place] FDA and  has been authorized for detection and/or diagnosis of SARS-CoV-2 by FDA under an Emergency Use Authorization (EUA). This EUA will remain  in effect (meaning this test can be used) for the duration of the COVID-19 declaration under Section 564(b)(1) of the Act, 21 U.S.C.section 360bbb-3(b)(1), unless the authorization is terminated  or revoked sooner.       Influenza A by PCR NEGATIVE NEGATIVE   Influenza B by PCR NEGATIVE NEGATIVE    Comment: (NOTE) The Xpert Xpress SARS-CoV-2/FLU/RSV plus assay is intended as an aid in the diagnosis of influenza from Nasopharyngeal swab specimens and should not be used as a sole basis for treatment. Nasal washings and aspirates are unacceptable for Xpert Xpress SARS-CoV-2/FLU/RSV testing.  Fact Sheet for Patients: BloggerCourse.com  Fact Sheet for Healthcare  Providers: SeriousBroker.it  This test is not yet approved or cleared by the [place] FDA and has been authorized for detection and/or diagnosis of SARS-CoV-2 by FDA under an Emergency Use Authorization (EUA). This EUA will remain in effect (meaning this test can be used) for the duration of the COVID-19 declaration under Section 564(b)(1) of the Act, 21 U.S.C. section 360bbb-3(b)(1), unless the authorization is terminated or revoked.     Resp Syncytial Virus by PCR NEGATIVE NEGATIVE    Comment: (NOTE) Fact Sheet for Patients: BloggerCourse.com  Fact Sheet for Healthcare Providers: SeriousBroker.it  This test is not yet approved or cleared by the [place] FDA and has been authorized for detection and/or diagnosis of SARS-CoV-2 by FDA under an Emergency Use Authorization (EUA). This EUA will remain in effect (meaning this test can be used)  for the duration of the COVID-19 declaration under Section 564(b)(1) of the Act, 21 U.S.C. section 360bbb-3(b)(1), unless the authorization is terminated or revoked.  Performed at [facility], [address] [place], [place] [address]   Urinalysis, Routine w reflex microscopic -Urine, Clean Catch     Status: Abnormal   Collection Time: 11/12/22  1:41 PM  Result Value Ref Range   Color, Urine YELLOW YELLOW   APPearance CLEAR CLEAR   Specific Gravity, Urine 1.031 (H) 1.005 - 1.030   pH 5.0 5.0 - 8.0   Glucose, UA NEGATIVE NEGATIVE mg/dL   Hgb urine dipstick NEGATIVE NEGATIVE   Bilirubin Urine NEGATIVE NEGATIVE   Ketones, ur 20 (A) NEGATIVE mg/dL   Protein, ur NEGATIVE NEGATIVE mg/dL   Nitrite NEGATIVE NEGATIVE   Leukocytes,Ua NEGATIVE NEGATIVE    Comment: Performed at [facility], [address] [place], [place] [address]  Blood Culture (routine x 2)     Status: None (Preliminary result)   Collection Time:  11/12/22  1:57 PM   Specimen: BLOOD  Result Value Ref Range   Specimen Description      BLOOD RIGHT ANTECUBITAL Performed at [facility], [address] [place], [place] [address]    Special Requests      BOTTLES DRAWN AEROBIC AND ANAEROBIC Blood Culture adequate volume Performed at [facility], [address], [place], [place] [address]    Culture PENDING    Report Status PENDING   I-Stat Lactic Acid, ED     Status: Abnormal   Collection Time: 11/12/22  2:01 PM  Result Value Ref Range   Lactic Acid, Venous 2.0 (HH) 0.5 - 1.9 mmol/L  Protime-INR     Status: None   Collection Time: 11/12/22  2:05 PM  Result Value Ref Range   Prothrombin Time 13.6 11.4 - 15.2 seconds   INR 1.0 0.8 - 1.2    Comment: (NOTE) INR goal varies based on device and disease states. Performed at [facility], [address] [place], [place] [address]   APTT     Status: None   Collection Time: 11/12/22  2:05 PM  Result Value Ref Range   aPTT 25 24 - 36 seconds    Comment: Performed at [facility], [address] [place], [place] [address]  Blood Culture (routine x 2)     Status: None (Preliminary result)   Collection Time: 11/12/22  2:06 PM   Specimen: BLOOD  Result Value Ref Range   Specimen Description      BLOOD RIGHT ANTECUBITAL Performed at [facility], [address] [place], [place] [address]    Special Requests      BOTTLES DRAWN AEROBIC AND ANAEROBIC Blood Culture adequate volume Performed at [facility], [address] [place], [place] [address]    Culture PENDING    Report Status PENDING   I-Stat Lactic Acid, ED     Status: None   Collection Time: 11/12/22  3:50 PM  Result Value Ref Range   Lactic Acid, Venous 1.2 0.5 - 1.9 mmol/L   DG Chest Port 1 View  Result Date: 11/12/2022 CLINICAL DATA:  Questionable sepsis. EXAM: PORTABLE CHEST 1 VIEW COMPARISON:  None Available. FINDINGS: Normal  mediastinum and cardiac silhouette. Normal pulmonary vasculature. No evidence of effusion, infiltrate, or pneumothorax. No acute bony abnormality. IMPRESSION: No acute cardiopulmonary process. Electronically Signed   By: Loura Halt.D.  On: 11/12/2022 14:04   CT ABDOMEN PELVIS W CONTRAST  Result Date: 11/12/2022 CLINICAL DATA:  Abdominal pain, acute. Vomiting. Patient reports eating seafood last night and then getting sick this morning. EXAM: CT ABDOMEN AND PELVIS WITH CONTRAST TECHNIQUE: Multidetector CT imaging of the abdomen and pelvis was performed using the standard protocol following bolus administration of intravenous contrast. RADIATION DOSE REDUCTION: This exam was performed according to the departmental dose-optimization program which includes automated exposure control, adjustment of the mA and/or kV according to patient size and/or use of iterative reconstruction technique. CONTRAST:  OMNIPAQUE IOHEXOL 300 MG/ML  SOLN COMPARISON:  07/28/2013 FINDINGS: Lower chest: No acute abnormality. Hepatobiliary: No focal liver abnormality is seen. No gallstones, gallbladder wall thickening, or biliary dilatation. Pancreas: Unremarkable. No pancreatic ductal dilatation or surrounding inflammatory changes. Spleen: Normal in size without focal abnormality. Adrenals/Urinary Tract: Adrenal glands are unremarkable. Kidneys are normal, without renal calculi, focal lesion, or hydronephrosis. Bladder is unremarkable. Stomach/Bowel: Stomach is within normal limits. There is no pathologic dilatation of the scratch small bowel loops appear normal. The appendix is visualized and appears normal. There is diffuse colonic wall edema identified from the cecum up to the mid sigmoid colon. Vascular/Lymphatic: Upper abdominal vascularity is patent. Normal appearance of the abdominal aorta. No signs of abdominopelvic adenopathy. Reproductive: Prostate is unremarkable. Other: No significant free fluid or fluid  collections. No signs of pneumoperitoneum. Musculoskeletal: No acute or suspicious osseous findings. Degenerative disc disease noted at L5-S1. IMPRESSION: 1. Diffuse colonic wall edema identified from the cecum up to the mid sigmoid colon. Findings are compatible with colitis. 2. Degenerative disc disease at L5-S1. Electronically Signed   By: Signa Kell M.D.   On: 11/12/2022 13:41    Pending Labs Unresulted Labs (From admission, onward)     Start     Ordered   11/13/22 0500  Comprehensive metabolic panel  Daily,   R      11/12/22 2118   11/13/22 0500  CBC  Daily,   R      11/12/22 2118   11/12/22 2118  Gastrointestinal Panel by PCR , Stool  (Gastrointestinal Panel by PCR, Stool                                                                                                                                                     **Does Not include CLOSTRIDIUM DIFFICILE testing. **If CDIFF testing is needed, place order from the "C Difficile Testing" order set.**)  Once,   R       Question Answer Comment  Patient immune status Immunocompromised   Release to patient Immediate      11/12/22 2118   11/12/22 2118  C Difficile Quick Screen w PCR reflex  (C Difficile quick screen w PCR reflex panel )  Once, for 24 hours,   TIMED  References:    CDiff Information Tool   11/12/22 2118            Vitals/Pain Today's Vitals   11/12/22 1630 11/12/22 1719 11/12/22 1943 11/12/22 2215  BP: 132/68  (!) 116/57   Pulse: 61  (!) 56   Resp: 14  14   Temp:   98.8 F (37.1 C)   TempSrc:   Oral   SpO2: 98%  98%   Weight:      Height:      PainSc:  6   8     Isolation Precautions Enteric precautions (UV disinfection)  Medications Medications  bictegravir-emtricitabine-tenofovir AF (BIKTARVY) 50-200-25 MG per tablet 1 tablet (has no administration in time range)  enoxaparin (LOVENOX) injection 40 mg (has no administration in time range)  sodium chloride flush (NS) 0.9 % injection 3 mL (3  mLs Intravenous Not Given 11/12/22 2211)  sodium chloride flush (NS) 0.9 % injection 3 mL (3 mLs Intravenous Not Given 11/12/22 2212)  sodium chloride flush (NS) 0.9 % injection 3 mL (has no administration in time range)  0.9 %  sodium chloride infusion (has no administration in time range)  lactated ringers infusion ( Intravenous New Bag/Given 11/12/22 2200)  acetaminophen (TYLENOL) tablet 650 mg (has no administration in time range)    Or  acetaminophen (TYLENOL) suppository 650 mg (has no administration in time range)  ondansetron (ZOFRAN) tablet 4 mg (has no administration in time range)    Or  ondansetron (ZOFRAN) injection 4 mg (has no administration in time range)  morphine (PF) 2 MG/ML injection 1 mg (has no administration in time range)  piperacillin-tazobactam (ZOSYN) IVPB 3.375 g (has no administration in time range)  ondansetron (ZOFRAN) injection 4 mg (4 mg Intravenous Given 11/12/22 1229)  sodium chloride 0.9 % bolus 1,000 mL (0 mLs Intravenous Stopped 11/12/22 1500)  acetaminophen (TYLENOL) tablet 650 mg (650 mg Oral Given 11/12/22 1230)  iohexol (OMNIPAQUE) 300 MG/ML solution 100 mL (100 mLs Intravenous Contrast Given 11/12/22 1253)  sodium chloride 0.9 % bolus 1,000 mL (0 mLs Intravenous Stopped 11/12/22 2131)  acetaminophen (TYLENOL) tablet 650 mg (650 mg Oral Given 11/12/22 1643)  amoxicillin-clavulanate (AUGMENTIN) 875-125 MG per tablet 1 tablet (1 tablet Oral Given 11/12/22 1643)  morphine (PF) 2 MG/ML injection 2 mg (2 mg Intravenous Given 11/12/22 1643)  sodium chloride 0.9 % bolus 1,000 mL (1,000 mLs Intravenous New Bag/Given 11/12/22 1803)  ondansetron (ZOFRAN) injection 4 mg (4 mg Intravenous Given 11/12/22 2037)  morphine (PF) 2 MG/ML injection 2 mg (2 mg Intravenous Given 11/12/22 2037)  piperacillin-tazobactam (ZOSYN) IVPB 3.375 g ( Intravenous IV Pump Association 11/12/22 2047)    Mobility walks     Focused Assessments    R Recommendations: See Admitting Provider  Note  Report given to:   Additional Notes:

## 2022-11-12 NOTE — ED Provider Notes (Signed)
[place] [facility] AT [facility] Provider Note   CSN: [id number] Arrival date & time: 11/12/22  4098     History  Chief Complaint  Patient presents with   Abdominal Pain   Emesis    Steve Hernandez is a 47 y.o. male History includes HIV, hypertension, hyperthyroidism.  Patient presents today for abdominal pain nausea vomiting diarrhea, symptom onset early this morning around 3:30 in the morning when he got up for work.  He reports multiple episodes of nonbloody/nonbilious emesis and nonbloody diarrhea.  He reports that after several episodes he developed some cramping diffuse abdominal pain he reports it is moderate-severe constant worsens with diarrhea and vomiting and without alleviating factors.  Pain does not radiate.  He does report eating some shellfish yesterday, he is unaware of any of the people he went out with are sick as well.  He reports he was feeling fine yesterday for his onset of symptoms earlier this morning.  He denies measuring fever at home, he denies chest pain, difficulty breathing, dysuria/hematuria or any additional concerns.  HPI     Home Medications Prior to Admission medications   Medication Sig Start Date End Date Taking? Authorizing Provider  amoxicillin-clavulanate (AUGMENTIN) 875-125 MG tablet Take 1 tablet by mouth every 12 (twelve) hours for 7 days. 11/12/22 11/19/22 Yes Harlene Salts A, PA-C  dicyclomine (BENTYL) 20 MG tablet Take 1 tablet (20 mg total) by mouth 2 (two) times daily. 08/14/15   Rolland Porter, MD  diphenoxylate-atropine (LOMOTIL) 2.5-0.025 MG tablet Take 1 tablet by mouth 4 (four) times daily as needed for diarrhea or loose stools. 08/14/15   Rolland Porter, MD  emtricitabine-tenofovir (TRUVADA) 200-300 MG per tablet Take 1 tablet by mouth daily.    [provider]  HYDROcodone-acetaminophen (NORCO) 10-325 MG tablet Take 1 tablet by mouth every 4 (four) hours as needed (for pain.).    [provider]   methocarbamol (ROBAXIN) 500 MG tablet Take 1 tablet (500 mg total) by mouth every 8 (eight) hours as needed for muscle spasms. 12/12/21   Sabas Sous, MD  naproxen (NAPROSYN) 500 MG tablet Take 500 mg by mouth 2 (two) times daily as needed (pain).    [provider]  NORVIR 100 MG TABS tablet Take 100 mg by mouth daily with breakfast. 07/22/15   [provider]  ondansetron (ZOFRAN ODT) 4 MG disintegrating tablet Take 1 tablet (4 mg total) by mouth every 8 (eight) hours as needed for nausea. 08/14/15   Rolland Porter, MD  REYATAZ 200 MG capsule Take 400 mg by mouth daily. 07/22/15   [provider]  ritonavir (NORVIR) 100 MG capsule Take 100 mg by mouth daily.     [provider]      Allergies    Sulfamethoxazole-trimethoprim and Primaquine    Review of Systems   Review of Systems Ten systems are reviewed and are negative for acute change except as noted in the HPI  Physical Exam Updated Vital Signs BP 134/60   Pulse 70   Temp (!) 101.9 F (38.8 C) (Oral)   Resp (!) 22   Ht 6\' 2"  (1.88 m)   Wt 72.6 kg   SpO2 95%   BMI 20.55 kg/m  Physical Exam Constitutional:      General: He is not in acute distress.    Appearance: Normal appearance. He is well-developed. He is not ill-appearing or diaphoretic.  HENT:     Head: Normocephalic and atraumatic.  Eyes:  General: Vision grossly intact. Gaze aligned appropriately.     Pupils: Pupils are equal, round, and reactive to light.  Neck:     Trachea: Trachea and phonation normal.  Pulmonary:     Effort: Pulmonary effort is normal. No respiratory distress.  Abdominal:     General: There is no distension.     Palpations: Abdomen is soft.     Tenderness: There is abdominal tenderness. There is no guarding or rebound.  Musculoskeletal:        General: Normal range of motion.     Cervical back: Normal range of motion.  Skin:    General: Skin is warm and dry.  Neurological:     Mental Status: He is  alert.     GCS: GCS eye subscore is 4. GCS verbal subscore is 5. GCS motor subscore is 6.     Comments: Speech is clear and goal oriented, follows commands Major Cranial nerves without deficit, no facial droop Moves extremities without ataxia, coordination intact  Psychiatric:        Behavior: Behavior normal.     ED Results / Procedures / Treatments   Labs (all labs ordered are listed, but only abnormal results are displayed) Labs Reviewed  COMPREHENSIVE METABOLIC PANEL - Abnormal; Notable for the following components:      Result Value   Glucose, Bld 104 (*)    Total Protein 8.4 (*)    All other components within normal limits  CBC - Abnormal; Notable for the following components:   WBC 11.6 (*)    Hemoglobin 11.5 (*)    HCT 37.4 (*)    All other components within normal limits  URINALYSIS, ROUTINE W REFLEX MICROSCOPIC - Abnormal; Notable for the following components:   Specific Gravity, Urine 1.031 (*)    Ketones, ur 20 (*)    All other components within normal limits  I-STAT CG4 LACTIC ACID, ED - Abnormal; Notable for the following components:   Lactic Acid, Venous 2.0 (*)    All other components within normal limits  RESP PANEL BY RT-PCR (RSV, FLU A&B, COVID)  RVPGX2  CULTURE, BLOOD (ROUTINE X 2)  CULTURE, BLOOD (ROUTINE X 2)  LIPASE, BLOOD  PROTIME-INR  APTT  I-STAT CG4 LACTIC ACID, ED    EKG None  Radiology DG Chest Port 1 View  Result Date: 11/12/2022 CLINICAL DATA:  Questionable sepsis. EXAM: PORTABLE CHEST 1 VIEW COMPARISON:  None Available. FINDINGS: Normal mediastinum and cardiac silhouette. Normal pulmonary vasculature. No evidence of effusion, infiltrate, or pneumothorax. No acute bony abnormality. IMPRESSION: No acute cardiopulmonary process. Electronically Signed   By: Genevive Bi M.D.   On: 11/12/2022 14:04   CT ABDOMEN PELVIS W CONTRAST  Result Date: 11/12/2022 CLINICAL DATA:  Abdominal pain, acute. Vomiting. Patient reports eating seafood last  night and then getting sick this morning. EXAM: CT ABDOMEN AND PELVIS WITH CONTRAST TECHNIQUE: Multidetector CT imaging of the abdomen and pelvis was performed using the standard protocol following bolus administration of intravenous contrast. RADIATION DOSE REDUCTION: This exam was performed according to the departmental dose-optimization program which includes automated exposure control, adjustment of the mA and/or kV according to patient size and/or use of iterative reconstruction technique. CONTRAST:  OMNIPAQUE IOHEXOL 300 MG/ML  SOLN COMPARISON:  07/28/2013 FINDINGS: Lower chest: No acute abnormality. Hepatobiliary: No focal liver abnormality is seen. No gallstones, gallbladder wall thickening, or biliary dilatation. Pancreas: Unremarkable. No pancreatic ductal dilatation or surrounding inflammatory changes. Spleen: Normal in size without focal abnormality. Adrenals/Urinary  Tract: Adrenal glands are unremarkable. Kidneys are normal, without renal calculi, focal lesion, or hydronephrosis. Bladder is unremarkable. Stomach/Bowel: Stomach is within normal limits. There is no pathologic dilatation of the scratch small bowel loops appear normal. The appendix is visualized and appears normal. There is diffuse colonic wall edema identified from the cecum up to the mid sigmoid colon. Vascular/Lymphatic: Upper abdominal vascularity is patent. Normal appearance of the abdominal aorta. No signs of abdominopelvic adenopathy. Reproductive: Prostate is unremarkable. Other: No significant free fluid or fluid collections. No signs of pneumoperitoneum. Musculoskeletal: No acute or suspicious osseous findings. Degenerative disc disease noted at L5-S1. IMPRESSION: 1. Diffuse colonic wall edema identified from the cecum up to the mid sigmoid colon. Findings are compatible with colitis. 2. Degenerative disc disease at L5-S1. Electronically Signed   By: Signa Kell M.D.   On: 11/12/2022 13:41    Procedures Procedures     Medications Ordered in ED Medications  sodium chloride 0.9 % bolus 1,000 mL (has no administration in time range)  ondansetron (ZOFRAN) injection 4 mg (4 mg Intravenous Given 11/12/22 1229)  sodium chloride 0.9 % bolus 1,000 mL (1,000 mLs Intravenous New Bag/Given 11/12/22 1230)  acetaminophen (TYLENOL) tablet 650 mg (650 mg Oral Given 11/12/22 1230)  iohexol (OMNIPAQUE) 300 MG/ML solution 100 mL (100 mLs Intravenous Contrast Given 11/12/22 1253)    ED Course/ Medical Decision Making/ A&P Clinical Course as of 11/12/22 1530  Sun Nov 12, 2022  1223 Febrile.  No tachycardia or tachypnea.  Does not currently meet sepsis criteria.  Awaiting white count. [BM]  1439 Augmentin [BM]  1442 Patient reassessed, resting comfortably bed no acute distress. [BM]    Clinical Course User Index [BM] Elizabeth Palau                                 Medical Decision Making 47 year old male history as above presented for evaluation of abdominal pain, nausea, vomiting and diarrhea.  Is experiencing nonbloody/nonbilious emesis and nonbloody diarrhea since 330 this morning.  He believes his symptoms are due to eating some crab last night.  He reports he is feeling well prior to this.  He has developed abdominal pain this morning.  On exam he is in no acute distress, he is given Zofran by RN.  Vital signs are stable on room air however he does have a low-grade fever.  Will obtain abdominal pain labs and give IV fluid.    Amount and/or Complexity of Data Reviewed External Data Reviewed: labs.    Details: Reviewed labs, February 10, 2022 patient had a CD4 count which was within normal limits, T-helper is 325. Labs: ordered.    Details: CBC shows mild leukocytosis of 11.6.  Mild anemia of 11.5.  No leukocytosis. Urinalysis without evidence of infection. CMP without emergent electrolyte derangement, AKI, LFT elevations or gap. Lipase normal limits, doubt pancreatitis. COVID/influenza panel and RSV  negative. INR within normal limits. Lactic slightly elevated at 2.0. Radiology: ordered and independent interpretation performed.    Details: I personally reviewed patient's chest x-ray.  I do not appreciate any obvious PTX, PNA or effusion.  Please see radiology interpretation.  I Personally reviewed patient's CT abdomen pelvis.  I do not appreciate any obvious free fluid or obstruction.  Please see radiology interpretation of edema of the colonic wall compatible with colitis. ECG/medicine tests: ordered.  Risk OTC drugs. Prescription drug management.   3 PM: Patient reassessed  he is resting comfortably bed no acute distress.  We discussed results as above, patient stated understanding.  Plan to continue monitoring and give additional IV fluid.  Recheck lactic acid.  Pending improvement and p.o. challenge anticipate discharge with Augmentin for treatment of colitis.  Plan discussed with attending physician Dr. Jacqulyn Bath who agrees.  Care handoff given to [facility] at time of shift change.  Plan of care will be to await delta lactic.  Recheck vitals and reassess patient.  Pending improvement in p.o. challenge anticipate discharge to home.    Note: Portions of this report may have been transcribed using voice recognition software. Every effort was made to ensure accuracy; however, inadvertent computerized transcription errors may still be present.         Final Clinical Impression(s) / ED Diagnoses Final diagnoses:  Colitis    Rx / DC Orders ED Discharge Orders          Ordered    amoxicillin-clavulanate (AUGMENTIN) 875-125 MG tablet  Every 12 hours        11/12/22 1518              Elizabeth Palau 11/12/22 1530    Long, Arlyss Repress, MD 11/21/22 [phone]

## 2022-11-12 NOTE — ED Triage Notes (Signed)
Patient is here for evaluation of abdominal pain and vomiting that started at 0400 today. Pt reports eating seafood last night and then got sick this morning.

## 2022-11-12 NOTE — H&P (Addendum)
History and Physical    Steve Hernandez:[id number] DOB: Apr 02, 1975 DOA: 11/12/2022  PCP: Pcp, No   Patient coming from: Home   Chief Complaint:  Chief Complaint  Patient presents with   Abdominal Pain   Emesis    HPI:  Steve Hernandez is a 47 y.o. male with medical history significant of HIV enthesis on Biktarvy and follows infectious disease at [facility]), G6PD deficiency, HIV, essential hypertension, hypothyroidism and history of syphilis treated for 2014 presented to emergency department evaluation for abdominal pain and vomiting that started around 4 am today 11/12/2022.  Patient reported that he had ate some seafood last night and started to get sick this morning. Patient reported that since this morning he has countless episodes of nonbilious/nonbloody vomiting as well as 8-10 episodes of diarrhea as well.  Patient also has abdominal pain in the periumbilical and right lower quadrant region.  Abdomen pain is sporadic in nature, 8 out of 10 intensity and there is no associated relieving or aggravating factor. Patient did everything started since he had seafood last night. Patient denies any associated fever and chills.  Denies any known sick contact.  Patient deniesany diaphoresis, chest pain, palpitation, cough, sinus pain/pressure, headache, dizziness and syncope.  Reviewed home medication with patient he is taking Biktarvy only at this time.  ED Course:  At presentation to ED patient has low-grade fever 100.6 F, respiratory 18, heart rate 71, blood pressure 170/72 and O2 sat 93% room air. Normal lipase 22 CMP grossly unremarkable. CBC showing chronic leukocytosis 11.6, stable H&H 11.5 and 37.4, MCV 88 and platelet count 160. Respiratory panel negative for COVID. UA showed increased specific gravity and ketones. Lactic acid improved 2 to 1.2 after 2 L of NS bolus in the ED. Blood culture is pending.  CT abdomen pelvis showed: IMPRESSION: 1. Diffuse colonic wall edema  identified from the cecum up to the mid sigmoid colon. Findings are compatible with colitis. 2. Degenerative disc disease at L5-S1.  Chest x-ray no acute cardiopulmonary process.  In the ED patient received Augmentin 1 dose, morphine 2 mg once, Zofran 4 mg, Zosyn for concern for intra-abdominal infection and 2 L of NS bolus.  Hospitalist has been contacted for further evaluation management of nausea vomiting and colitis.  Review of Systems:  Review of Systems  Constitutional:  Negative for chills, fever, malaise/fatigue and weight loss.  Respiratory:  Negative for cough, sputum production, shortness of breath and wheezing.   Cardiovascular:  Positive for palpitations. Negative for chest pain and leg swelling.  Gastrointestinal:  Positive for abdominal pain, diarrhea, nausea and vomiting. Negative for blood in stool, constipation, heartburn and melena.  Genitourinary:  Negative for dysuria, frequency and urgency.  Musculoskeletal:  Negative for joint pain and myalgias.  Neurological:  Negative for dizziness, weakness and headaches.  Psychiatric/Behavioral:  The patient is not nervous/anxious.     Past Medical History:  Diagnosis Date   HIV (human immunodeficiency virus infection) (HCC)    Hypertension    Hyperthyroidism     History reviewed. No pertinent surgical history.   reports that he has never smoked. He does not have any smokeless tobacco history on file. He reports current drug use. Drug: Marijuana. He reports that he does not drink alcohol.  Allergies  Allergen Reactions   Sulfamethoxazole-Trimethoprim Hives   Primaquine Other (See Comments)    G6PD deficiency    Family History  Problem Relation Age of Onset   Hypertension Other    Cancer Other  Diabetes Mother     Prior to Admission medications   Medication Sig Start Date End Date Taking? Authorizing Provider  amoxicillin-clavulanate (AUGMENTIN) 875-125 MG tablet Take 1 tablet by mouth every 12 (twelve)  hours for 7 days. 11/12/22 11/19/22 Yes Harlene Salts A, PA-C  dicyclomine (BENTYL) 20 MG tablet Take 1 tablet (20 mg total) by mouth 2 (two) times daily. 08/14/15   Rolland Porter, MD  diphenoxylate-atropine (LOMOTIL) 2.5-0.025 MG tablet Take 1 tablet by mouth 4 (four) times daily as needed for diarrhea or loose stools. 08/14/15   Rolland Porter, MD  emtricitabine-tenofovir (TRUVADA) 200-300 MG per tablet Take 1 tablet by mouth daily.    [provider]  HYDROcodone-acetaminophen (NORCO) 10-325 MG tablet Take 1 tablet by mouth every 4 (four) hours as needed (for pain.).    [provider]  methocarbamol (ROBAXIN) 500 MG tablet Take 1 tablet (500 mg total) by mouth every 8 (eight) hours as needed for muscle spasms. 12/12/21   Sabas Sous, MD  naproxen (NAPROSYN) 500 MG tablet Take 500 mg by mouth 2 (two) times daily as needed (pain).    [provider]  NORVIR 100 MG TABS tablet Take 100 mg by mouth daily with breakfast. 07/22/15   [provider]  ondansetron (ZOFRAN ODT) 4 MG disintegrating tablet Take 1 tablet (4 mg total) by mouth every 8 (eight) hours as needed for nausea. 08/14/15   Rolland Porter, MD  REYATAZ 200 MG capsule Take 400 mg by mouth daily. 07/22/15   [provider]  ritonavir (NORVIR) 100 MG capsule Take 100 mg by mouth daily.     [provider]     Physical Exam: Vitals:   11/12/22 1245 11/12/22 1608 11/12/22 1630 11/12/22 1943  BP: 134/60 124/66 132/68 (!) 116/57  Pulse: 70 63 61 (!) 56  Resp: (!) 22 17 14 14   Temp:  (!) 100.9 F (38.3 C)  98.8 F (37.1 C)  TempSrc:  Oral  Oral  SpO2: 95% 99% 98% 98%  Weight:      Height:        Physical Exam Constitutional:      General: He is not in acute distress.    Appearance: He is not ill-appearing.  HENT:     Head: Normocephalic and atraumatic.  Cardiovascular:     Rate and Rhythm: Normal rate and regular rhythm.     Heart sounds: Normal heart sounds.  Pulmonary:     Effort:  Pulmonary effort is normal.     Breath sounds: Normal breath sounds.  Abdominal:     General: Bowel sounds are normal.     Palpations: Abdomen is soft.     Tenderness: There is no abdominal tenderness. There is no guarding or rebound.     Hernia: No hernia is present.  Skin:    General: Skin is dry.     Capillary Refill: Capillary refill takes less than 2 seconds.  Neurological:     Mental Status: He is oriented to person, place, and time.      Labs on Admission: I have personally reviewed following labs and imaging studies  CBC: Recent Labs  Lab 11/12/22 1200  WBC 11.6*  HGB 11.5*  HCT 37.4*  MCV 88.0  PLT 160   Basic Metabolic Panel: Recent Labs  Lab 11/12/22 1200  NA 138  K 3.7  CL 103  CO2 24  GLUCOSE 104*  BUN 10  CREATININE 0.95  CALCIUM 9.1   GFR: Estimated Creatinine  Clearance: 98.7 mL/min (by C-G formula based on SCr of 0.95 mg/dL). Liver Function Tests: Recent Labs  Lab 11/12/22 1200  AST 37  ALT 23  ALKPHOS 47  BILITOT 1.1  PROT 8.4*  ALBUMIN 4.6   Recent Labs  Lab 11/12/22 1200  LIPASE 22   No results for input(s): "AMMONIA" in the last 168 hours. Coagulation Profile: Recent Labs  Lab 11/12/22 1405  INR 1.0   Cardiac Enzymes: No results for input(s): "CKTOTAL", "CKMB", "CKMBINDEX", "TROPONINI", "TROPONINIHS" in the last 168 hours. BNP (last 3 results) No results for input(s): "BNP" in the last 8760 hours. HbA1C: No results for input(s): "HGBA1C" in the last 72 hours. CBG: No results for input(s): "GLUCAP" in the last 168 hours. Lipid Profile: No results for input(s): "CHOL", "HDL", "LDLCALC", "TRIG", "CHOLHDL", "LDLDIRECT" in the last 72 hours. Thyroid Function Tests: No results for input(s): "TSH", "T4TOTAL", "FREET4", "T3FREE", "THYROIDAB" in the last 72 hours. Anemia Panel: No results for input(s): "VITAMINB12", "FOLATE", "FERRITIN", "TIBC", "IRON", "RETICCTPCT" in the last 72 hours. Urine analysis:    Component Value  Date/Time   COLORURINE YELLOW 11/12/2022 1341   APPEARANCEUR CLEAR 11/12/2022 1341   LABSPEC 1.031 (H) 11/12/2022 1341   PHURINE 5.0 11/12/2022 1341   GLUCOSEU NEGATIVE 11/12/2022 1341   HGBUR NEGATIVE 11/12/2022 1341   BILIRUBINUR NEGATIVE 11/12/2022 1341   KETONESUR 20 (A) 11/12/2022 1341   PROTEINUR NEGATIVE 11/12/2022 1341   UROBILINOGEN 1.0 07/28/2013 1730   NITRITE NEGATIVE 11/12/2022 1341   LEUKOCYTESUR NEGATIVE 11/12/2022 1341    Radiological Exams on Admission: I have personally reviewed images DG Chest Port 1 View  Result Date: 11/12/2022 CLINICAL DATA:  Questionable sepsis. EXAM: PORTABLE CHEST 1 VIEW COMPARISON:  None Available. FINDINGS: Normal mediastinum and cardiac silhouette. Normal pulmonary vasculature. No evidence of effusion, infiltrate, or pneumothorax. No acute bony abnormality. IMPRESSION: No acute cardiopulmonary process. Electronically Signed   By: Genevive Bi M.D.   On: 11/12/2022 14:04   CT ABDOMEN PELVIS W CONTRAST  Result Date: 11/12/2022 CLINICAL DATA:  Abdominal pain, acute. Vomiting. Patient reports eating seafood last night and then getting sick this morning. EXAM: CT ABDOMEN AND PELVIS WITH CONTRAST TECHNIQUE: Multidetector CT imaging of the abdomen and pelvis was performed using the standard protocol following bolus administration of intravenous contrast. RADIATION DOSE REDUCTION: This exam was performed according to the departmental dose-optimization program which includes automated exposure control, adjustment of the mA and/or kV according to patient size and/or use of iterative reconstruction technique. CONTRAST:  OMNIPAQUE IOHEXOL 300 MG/ML  SOLN COMPARISON:  07/28/2013 FINDINGS: Lower chest: No acute abnormality. Hepatobiliary: No focal liver abnormality is seen. No gallstones, gallbladder wall thickening, or biliary dilatation. Pancreas: Unremarkable. No pancreatic ductal dilatation or surrounding inflammatory changes. Spleen: Normal in size  without focal abnormality. Adrenals/Urinary Tract: Adrenal glands are unremarkable. Kidneys are normal, without renal calculi, focal lesion, or hydronephrosis. Bladder is unremarkable. Stomach/Bowel: Stomach is within normal limits. There is no pathologic dilatation of the scratch small bowel loops appear normal. The appendix is visualized and appears normal. There is diffuse colonic wall edema identified from the cecum up to the mid sigmoid colon. Vascular/Lymphatic: Upper abdominal vascularity is patent. Normal appearance of the abdominal aorta. No signs of abdominopelvic adenopathy. Reproductive: Prostate is unremarkable. Other: No significant free fluid or fluid collections. No signs of pneumoperitoneum. Musculoskeletal: No acute or suspicious osseous findings. Degenerative disc disease noted at L5-S1. IMPRESSION: 1. Diffuse colonic wall edema identified from the cecum up to the mid sigmoid  colon. Findings are compatible with colitis. 2. Degenerative disc disease at L5-S1. Electronically Signed   By: Signa Kell M.D.   On: 11/12/2022 13:41    EKG: My personal interpretation of EKG shows: EKG showed normal sinus rhythm heart rate 62.  There is no ST anterior abnormality.  Left ventricular hypertrophy pattern.     Assessment/Plan: Principal Problem:   Sepsis (HCC) Active Problems:   History of HIV infection (HCC)   Colitis   Nausea & vomiting   Abdominal pain    Assessment and Plan: Sepsis Colitis Nausea & vomiting Abdominal pain - Patient presenting with vomiting and diarrhea after eating some seafood last night.  In the ER patient has low-grade of fever, mild leukocytosis, elevated lactic acid and CT abdomen pelvis showed evidence of colitis meets the criteria for sepsis. - Normal lipase level. - CBC showed leukocytosis 11.6.  CMP grossly unremarkable. -Lactic acid has been improved 2 to 1.2 after 2 L of NS bolus in the ED. -UA unremarkable and respiratory panel negative. -Chest  x-ray  unremarkable. - CT abdomen pelvis showed diffuse chronic edema identified from cecum up to mild sigmoid colon.  Finding compatible with colitis. -In the ED patient has been treated with Augmentin and Zosyn for the concern for intra-abdominal infection. - Concern for viral gastroenteritis.  Given patient is immunocompromised planning to continue to treat empirically with IV Zosyn for now until blood cultures, GI panel and C. difficile results will be available. - In the ED blood cultures has been obtained and results are pending. - Checking GI panel and C. difficile panel. -Avoid Imodium until C. difficile rules out. - Continue maintenance fluid LR 125 cc/h for 1 day. - Continue morphine 1 mg every 4 hour as needed. - Continue Zofran as needed. - Continue to monitor fever curve. - Going to monitor CBC.  History of HIV -Continue Biktarvy once daily.  History of hypertension - Not on any medication currently at home  History of Graves' disease - Not on any medication at home currently.  Anemia of chronic disease - CBC showing stable H&H 11.5 and 37.  Continue to monitor.   DVT prophylaxis:  Lovenox Code Status:  Full Code Diet: Heart healthy diet. Family Communication: No family member at the bedside now Disposition Plan: Will follow-up with the stool study and blood culture results.  Tentative discharge to home next 2 to 4 days. Consults: None at this time Admission status:   Inpatient, Telemetry bed  Severity of Illness: The appropriate patient status for this patient is INPATIENT. Inpatient status is judged to be reasonable and necessary in order to provide the required intensity of service to ensure the patient's safety. The patient's presenting symptoms, physical exam findings, and initial radiographic and laboratory data in the context of their chronic comorbidities is felt to place them at high risk for further clinical deterioration. Furthermore, it is not anticipated  that the patient will be medically stable for discharge from the hospital within 2 midnights of admission.   * I certify that at the point of admission it is my clinical judgment that the patient will require inpatient hospital care spanning beyond 2 midnights from the point of admission due to high intensity of service, high risk for further deterioration and high frequency of surveillance required.Marland Kitchen    Tereasa Coop, MD [facility]  How to contact the [facility] Attending or Consulting provider 7A - 7P or covering provider during after hours 7P -7A, for this patient.  Check  the care team in [facility] and look for a) attending/consulting [facility] provider listed and b) the [facility] team listed Log into [email or web address] and use [place]'s universal password to access. If you do not have the password, please contact the hospital operator. Locate the [facility] provider you are looking for under [facility] and page to a number that you can be directly reached. If you still have difficulty reaching the provider, please page the [facility] (Director on Call) for the Hospitalists listed on amion for assistance.  11/12/2022, 9:44 PM

## 2022-11-13 DIAGNOSIS — F1910 Other psychoactive substance abuse, uncomplicated: Secondary | ICD-10-CM | POA: Diagnosis present

## 2022-11-13 LAB — GASTROINTESTINAL PANEL BY PCR, STOOL (REPLACES STOOL CULTURE)

## 2022-11-13 LAB — C DIFFICILE QUICK SCREEN W PCR REFLEX
C Diff antigen: NEGATIVE
C Diff interpretation: NOT DETECTED
C Diff toxin: NEGATIVE

## 2022-11-13 LAB — COMPREHENSIVE METABOLIC PANEL
ALT: 17 U/L (ref 0–44)
AST: 26 U/L (ref 15–41)
Albumin: 3.3 g/dL — ABNORMAL LOW (ref 3.5–5.0)
Alkaline Phosphatase: 33 U/L — ABNORMAL LOW (ref 38–126)
Anion gap: 8 (ref 5–15)
BUN: 10 mg/dL (ref 6–20)
CO2: 22 mmol/L (ref 22–32)
Calcium: 7.7 mg/dL — ABNORMAL LOW (ref 8.9–10.3)
Chloride: 106 mmol/L (ref 98–111)
Creatinine, Ser: 1.13 mg/dL (ref 0.61–1.24)
GFR, Estimated: >60 mL/min (ref >60)
Glucose, Bld: 112 mg/dL — ABNORMAL HIGH (ref 70–99)
Potassium: 3.1 mmol/L — ABNORMAL LOW (ref 3.5–5.1)
Sodium: 136 mmol/L (ref 135–145)
Total Bilirubin: 1.1 mg/dL (ref 0.3–1.2)
Total Protein: 6.5 g/dL (ref 6.5–8.1)

## 2022-11-13 LAB — CBC
HCT: 33.8 % — ABNORMAL LOW (ref 39.0–52.0)
Hemoglobin: 10.3 g/dL — ABNORMAL LOW (ref 13.0–17.0)
MCH: 27.2 pg (ref 26.0–34.0)
MCHC: 30.5 g/dL (ref 30.0–36.0)
MCV: 89.2 fL (ref 80.0–100.0)
Platelets: 140 10*3/uL — ABNORMAL LOW (ref 150–400)
RBC: 3.79 MIL/uL — ABNORMAL LOW (ref 4.22–5.81)
RDW: 11.9 % (ref 11.5–15.5)
WBC: 7.8 10*3/uL (ref 4.0–10.5)
nRBC: 0 % (ref 0.0–0.2)

## 2022-11-13 MED ORDER — ENOXAPARIN SODIUM 40 MG/0.4ML IJ SOSY
40.0000 mg | PREFILLED_SYRINGE | Freq: Every day | INTRAMUSCULAR | Status: DC
  Filled 2022-11-13 (×2): qty 0.4

## 2022-11-13 MED ORDER — POTASSIUM CHLORIDE CRYS ER 20 MEQ PO TBCR
40.0000 meq | EXTENDED_RELEASE_TABLET | Freq: Once | ORAL | Status: AC
  Administered 2022-11-13: 40 meq via ORAL
  Filled 2022-11-13: qty 2

## 2022-11-13 NOTE — Progress Notes (Signed)
Progress Note   Patient: Steve Hernandez RUE:[id number] DOB: 01/13/1976 DOA: 11/12/2022     1 DOS: the patient was seen and examined on 11/13/2022   Brief hospital course: 47yo with h/o HIV, HTN, and hypothyroidism who presented on 9/29 with abdominal pain and n/v. CT with colitis.  He was started on Augmentin and Zosyn.    Assessment and Plan:  Colitis -Patient's symptoms are c/w colitis and his CT supports this as a diagnosis -Possible sepsis - He was febrile with leukocytosis on presentation, mildly elevated lactic which has cleared -Blood cultures negative to date -Given regular diet on presentation; he reports improvement so will not downgrade at this time -For now, will IVF, pain medication with morphine, nausea medication with Zofran, and continue to treat with Zosyn for intraabdominal infection  HIV -Continue Biktarvy -Negative viral load recently  HTN -He does not appear to be taking medications for this issue at this time  -Normal BPs thus far  H/o Graves' disease -He does not appear to be taking medications for this issue at this time   Alcohol, marijuana abuse -Reports drinking 1/3 of a fifth most days and smoking THC periodically -Denies having a problem with these substances or withdrawal symptoms -Cessation encouraged      Consultants: ID - telephone only  Procedures: None  Antibiotics: Augmentin x 1 Zosyn 9/29-   30 Day Unplanned Readmission Risk Score    Flowsheet Row ED to Hosp-Admission (Current) from 11/12/2022 in [facility]  30 Day Unplanned Readmission Risk Score (%) 12.12 Filed at 11/13/2022 0801       This score is the patient's risk of an unplanned readmission within 30 days of being discharged (0 -100%). The score is based on dignosis, age, lab data, medications, orders, and past utilization.   Low:  0-14.9   Medium: 15-21.9   High: 22-29.9   Extreme: 30 and above           Subjective:  Feeling better - scant emesis overnight, last diarrhea was a couple of hours prior.  Tolerating diet.  Would like to go home today vs. Tomorrow.   Objective: Vitals:   11/13/22 1008 11/13/22 1425  BP: 125/76 107/84  Pulse: (!) 43 (!) 44  Resp: 18 18  Temp: 99.2 F (37.3 C) 98.7 F (37.1 C)  SpO2: 98% 98%    Intake/Output Summary (Last 24 hours) at 11/13/2022 1457 Last data filed at 11/13/2022 0452 Gross per 24 hour  Intake --  Output 4 ml  Net -4 ml   Filed Weights   11/12/22 0949 11/13/22 0349  Weight: 72.6 kg 72.1 kg    Exam:  General:  Appears calm and comfortable and is in NAD Eyes:  EOMI, normal lids, iris ENT:  grossly normal hearing, lips & tongue, mmm Neck:  no LAD, masses or thyromegaly Cardiovascular:  RRR, no m/r/g. No LE edema.  Respiratory:   CTA bilaterally with no wheezes/rales/rhonchi.  Normal respiratory effort. Abdomen:  soft, NT, ND Skin:  no rash or induration seen on limited exam Musculoskeletal:  grossly normal tone BUE/BLE, good ROM, no bony abnormality Psychiatric:  grossly normal mood and affect, speech fluent and appropriate, AOx3 Neurologic:  CN 2-12 grossly intact, moves all extremities in coordinated fashion  Data Reviewed: I have reviewed the patient's lab results since admission.  Pertinent labs for today include:   K+ 3.1 Glucose 112 Lactate 2.0, 1.2 WBC 7.8 Hgb 10.3 Platelets 140 C diff negative  Family Communication: Significant other was present throughout evaluation  Disposition: Status is: Inpatient Remains inpatient appropriate because: ongoing treatment     Time spent: 50 minutes  Unresulted Labs (From admission, onward)     Start     Ordered   11/13/22 0500  Comprehensive metabolic panel  Daily,   R      11/12/22 2118   11/13/22 0500  CBC  Daily,   R      11/12/22 2118   11/12/22 2118  Gastrointestinal Panel by PCR , Stool  (Gastrointestinal Panel by PCR, Stool                                                                                                                                                      **Does Not include CLOSTRIDIUM DIFFICILE testing. **If CDIFF testing is needed, place order from the "C Difficile Testing" order set.**)  Once,   R       Question Answer Comment  Patient immune status Immunocompromised   Release to patient Immediate      11/12/22 2118             Author: Jonah Blue, MD 11/13/2022 2:57 PM  For on call review [email or web address].

## 2022-11-13 NOTE — Plan of Care (Signed)

## 2022-11-13 NOTE — Hospital Course (Signed)
47yo with h/o HIV, HTN, and hypothyroidism who presented on 9/29 with abdominal pain and n/v. CT with colitis.  He was started on Augmentin and Zosyn.

## 2022-11-14 DIAGNOSIS — A039 Shigellosis, unspecified: Secondary | ICD-10-CM

## 2022-11-14 LAB — COMPREHENSIVE METABOLIC PANEL
ALT: 17 U/L (ref 0–44)
AST: 32 U/L (ref 15–41)
Albumin: 3.1 g/dL — ABNORMAL LOW (ref 3.5–5.0)
Alkaline Phosphatase: 30 U/L — ABNORMAL LOW (ref 38–126)
Anion gap: 7 (ref 5–15)
BUN: 8 mg/dL (ref 6–20)
CO2: 24 mmol/L (ref 22–32)
Calcium: 7.9 mg/dL — ABNORMAL LOW (ref 8.9–10.3)
Chloride: 105 mmol/L (ref 98–111)
Creatinine, Ser: 1.19 mg/dL (ref 0.61–1.24)
GFR, Estimated: >60 mL/min (ref >60)
Glucose, Bld: 95 mg/dL (ref 70–99)
Potassium: 3 mmol/L — ABNORMAL LOW (ref 3.5–5.1)
Sodium: 136 mmol/L (ref 135–145)
Total Bilirubin: 0.8 mg/dL (ref 0.3–1.2)
Total Protein: 6 g/dL — ABNORMAL LOW (ref 6.5–8.1)

## 2022-11-14 LAB — CBC
HCT: 34.3 % — ABNORMAL LOW (ref 39.0–52.0)
Hemoglobin: 10.4 g/dL — ABNORMAL LOW (ref 13.0–17.0)
MCH: 27.2 pg (ref 26.0–34.0)
MCHC: 30.3 g/dL (ref 30.0–36.0)
MCV: 89.8 fL (ref 80.0–100.0)
Platelets: 120 10*3/uL — ABNORMAL LOW (ref 150–400)
RBC: 3.82 MIL/uL — ABNORMAL LOW (ref 4.22–5.81)
RDW: 11.7 % (ref 11.5–15.5)
WBC: 5.4 10*3/uL (ref 4.0–10.5)
nRBC: 0 % (ref 0.0–0.2)

## 2022-11-14 NOTE — Plan of Care (Signed)

## 2022-11-14 NOTE — Progress Notes (Signed)
   11/14/22 0222  Assess: MEWS Score  Temp 98.7 F (37.1 C)  BP 117/85  MAP (mmHg) 94  Pulse Rate (!) 38  Resp 14  Assess: MEWS Score  MEWS Temp 0  MEWS Systolic 0  MEWS Pulse 2  MEWS RR 0  MEWS LOC 0  MEWS Score 2  MEWS Score Color Yellow  Assess: if the MEWS score is Yellow or Red  Were vital signs accurate and taken at a resting state? Yes  Does the patient meet 2 or more of the SIRS criteria? No  MEWS guidelines implemented  Yes, yellow  Treat  MEWS Interventions Considered administering scheduled or prn medications/treatments as ordered  Take Vital Signs  Increase Vital Sign Frequency  Yellow: Q2hr x1, continue Q4hrs until patient remains green for 12hrs  Escalate  MEWS: Escalate Yellow: Discuss with charge nurse and consider notifying provider and/or RRT  Notify: Charge Nurse/RN  Name of Charge Nurse/RN Notified Noralee Chars, RN  Provider Notification  Provider Name/Title Johann Capers NP  Date Provider Notified 11/14/22  Time Provider Notified 0424  Method of Notification Page  Notification Reason Other (Comment) (yellow mews)  Provider response No new orders  Date of Provider Response 11/14/22  Time of Provider Response 0424  Assess: SIRS CRITERIA  SIRS Temperature  0  SIRS Pulse 0  SIRS Respirations  0  SIRS WBC 0  SIRS Score Sum  0   Pt triggered Yellow MEWS for HR. HR dipping down to 30s while asleep. Rechecked HR still 41. Pt stated, this has been an ongoing issue for him for a few years, apparently discovered it the last time he was in the hospital and states he got checked outpatient for it but nothing ever came of it. He states they advised him to get an apple watch to track it and the apple watch will often wake him up in the middle of the night with low HR alerts. Notified Johann Capers NP of this.

## 2022-11-14 NOTE — Discharge Summary (Signed)
Physician Discharge Summary   Patient: Steve Hernandez MRN: [id number] DOB: 08-May-1975  Admit date:     11/12/2022  Discharge date: 11/14/22  Discharge Physician: Jonah Blue   PCP: Pcp, No   Recommendations at discharge:   You have Shigella-associated colitis Symptoms should continue to improve, try to eat bland diet  If symptoms worsen, call your doctor for antibiotic Follow up at [facility]; call to set up an appointment Cut back on/stop drinking alcohol and using marijuana  Discharge Diagnoses: Principal Problem:   Sepsis (HCC) Active Problems:   History of HIV infection (HCC)   Colitis   Essential hypertension   Graves disease   Polysubstance abuse [facility])     Hospital Course: 47yo with h/o HIV, HTN, and hypothyroidism who presented on 9/29 with abdominal pain and n/v. CT with colitis.  He was started on Augmentin and Zosyn.    Assessment and Plan:  Colitis associated with Shigella infection -Patient's symptoms are c/w colitis and his CT supports this as a diagnosis -Possible sepsis on admission - He was febrile with leukocytosis on presentation, mildly elevated lactic which has cleared; sepsis has been ruled out -Blood cultures negative to date x 2 days -Tolerating regular diet on presentation -Will stop Zosyn -Discussed with ID, Dr. Thedore Mins - given immunocompetent status and improvement in symptoms, antibiotics are not needed at this time -Should symptoms worsen, azithromycin 500 mg daily x 3 days could be given -Will dc to home   HIV -Continue Biktarvy -Negative viral load recently   HTN -He does not appear to be taking medications for this issue at this time  -Normal BPs thus far   H/o Graves' disease -He does not appear to be taking medications for this issue at this time    Alcohol, marijuana abuse -Reports drinking 1/3 of a fifth most days and smoking THC periodically -Denies having a problem with these substances or withdrawal  symptoms -Cessation encouraged           Consultants: ID - telephone only   Procedures: None   Antibiotics: Augmentin x 1 Zosyn 9/29-     30 Day Unplanned Readmission Risk Score    Flowsheet Row ED to Hosp-Admission (Current) from 11/12/2022 in [facility]  30 Day Unplanned Readmission Risk Score (%) 15.6 Filed at 11/14/2022 0801       This score is the patient's risk of an unplanned readmission within 30 days of being discharged (0 -100%). The score is based on dignosis, age, lab data, medications, orders, and past utilization.   Low:  0-14.9   Medium: 15-21.9   High: 22-29.9   Extreme: 30 and above          Pain control - [place] Controlled Substance Reporting System database was reviewed. and patient was instructed, not to drive, operate heavy machinery, perform activities at heights, swimming or participation in water activities or provide baby-sitting services while on Pain, Sleep and Anxiety Medications; until their outpatient Physician has advised to do so again. Also recommended to not to take more than prescribed Pain, Sleep and Anxiety Medications.   Disposition: Home Diet recommendation:  Regular diet DISCHARGE MEDICATION: Allergies as of 11/14/2022       Reactions   Sulfamethoxazole-trimethoprim Hives   Primaquine Other (See Comments)   G6PD deficiency        Medication List     TAKE these medications    Biktarvy 50-200-25 MG Tabs tablet Generic drug: bictegravir-emtricitabine-tenofovir  AF Take 1 tablet by mouth daily.        Follow-up Information     [facility] [place].   Specialty: Emergency Medicine Why: Return as needed for new or worsening symptoms Contact information: [address] [place] [address] [phone]        [facility].   Contact information: [address] [employer] [address] [place] [address] [phone]               Discharge Exam: Ceasar Mons Weights   11/12/22 0949 11/13/22 0349 11/14/22 0500  Weight: 72.6 kg 72.1 kg 74.3 kg      Subjective: Feeling better.  No further n/v since admission.  Diarrhea has improved with only 1 small liquid stool today.     Objective: Vitals:   11/14/22 0607 11/14/22 1035  BP: 114/77 117/80  Pulse: (!) 41 (!) 47  Resp: 15   Temp: 98.3 F (36.8 C) 98 F (36.7 C)  SpO2: 97% 100%    Intake/Output Summary (Last 24 hours) at 11/14/2022 1058 Last data filed at 11/13/2022 1800 Gross per 24 hour  Intake 2438.59 ml  Output --  Net 2438.59 ml   Filed Weights   11/12/22 0949 11/13/22 0349 11/14/22 0500  Weight: 72.6 kg 72.1 kg 74.3 kg    Exam:  General:  Appears calm and comfortable and is in NAD Eyes:  EOMI, normal lids, iris ENT:  grossly normal hearing, lips & tongue, mmm Neck:  no LAD, masses or thyromegaly Cardiovascular:  RRR, no m/r/g. No LE edema.  Respiratory:   CTA bilaterally with no wheezes/rales/rhonchi.  Normal respiratory effort. Abdomen:  soft, NT, ND Skin:  no rash or induration seen on limited exam Musculoskeletal:  grossly normal tone BUE/BLE, good ROM, no bony abnormality Psychiatric:  grossly normal mood and affect, speech fluent and appropriate, AOx3 Neurologic:  CN 2-12 grossly intact, moves all extremities in coordinated fashion  Data Reviewed: I have reviewed the patient's lab results since admission.  Pertinent labs for today include:  K+ 3.0 Albumin 3.1 WBC 5.4 Hgb 10.4 - stable Platelets 120 GI pathogen panel + Shigella/EIEC    Condition at discharge: improving  The results of significant diagnostics from this hospitalization (including imaging, microbiology, ancillary and laboratory) are listed below for reference.   Imaging Studies: DG Chest Port 1 View  Result Date: 11/12/2022 CLINICAL DATA:  Questionable sepsis. EXAM: PORTABLE CHEST 1 VIEW  COMPARISON:  None Available. FINDINGS: Normal mediastinum and cardiac silhouette. Normal pulmonary vasculature. No evidence of effusion, infiltrate, or pneumothorax. No acute bony abnormality. IMPRESSION: No acute cardiopulmonary process. Electronically Signed   By: Genevive Bi M.D.   On: 11/12/2022 14:04   CT ABDOMEN PELVIS W CONTRAST  Result Date: 11/12/2022 CLINICAL DATA:  Abdominal pain, acute. Vomiting. Patient reports eating seafood last night and then getting sick this morning. EXAM: CT ABDOMEN AND PELVIS WITH CONTRAST TECHNIQUE: Multidetector CT imaging of the abdomen and pelvis was performed using the standard protocol following bolus administration of intravenous contrast. RADIATION DOSE REDUCTION: This exam was performed according to the departmental dose-optimization program which includes automated exposure control, adjustment of the mA and/or kV according to patient size and/or use of iterative reconstruction technique. CONTRAST:  OMNIPAQUE IOHEXOL 300 MG/ML  SOLN COMPARISON:  07/28/2013 FINDINGS: Lower chest: No acute abnormality. Hepatobiliary: No focal liver abnormality is seen. No gallstones, gallbladder wall thickening, or biliary dilatation. Pancreas: Unremarkable. No pancreatic  ductal dilatation or surrounding inflammatory changes. Spleen: Normal in size without focal abnormality. Adrenals/Urinary Tract: Adrenal glands are unremarkable. Kidneys are normal, without renal calculi, focal lesion, or hydronephrosis. Bladder is unremarkable. Stomach/Bowel: Stomach is within normal limits. There is no pathologic dilatation of the scratch small bowel loops appear normal. The appendix is visualized and appears normal. There is diffuse colonic wall edema identified from the cecum up to the mid sigmoid colon. Vascular/Lymphatic: Upper abdominal vascularity is patent. Normal appearance of the abdominal aorta. No signs of abdominopelvic adenopathy. Reproductive: Prostate is unremarkable.  Other: No significant free fluid or fluid collections. No signs of pneumoperitoneum. Musculoskeletal: No acute or suspicious osseous findings. Degenerative disc disease noted at L5-S1. IMPRESSION: 1. Diffuse colonic wall edema identified from the cecum up to the mid sigmoid colon. Findings are compatible with colitis. 2. Degenerative disc disease at L5-S1. Electronically Signed   By: Signa Kell M.D.   On: 11/12/2022 13:41    Microbiology: Results for orders placed or performed during the hospital encounter of 11/12/22  Resp panel by RT-PCR (RSV, Flu A&B, Covid) Anterior Nasal Swab     Status: None   Collection Time: 11/12/22  1:16 PM   Specimen: Anterior Nasal Swab  Result Value Ref Range Status   SARS Coronavirus 2 by RT PCR NEGATIVE NEGATIVE Final    Comment: (NOTE) SARS-CoV-2 target nucleic acids are NOT DETECTED.  The SARS-CoV-2 RNA is generally detectable in upper respiratory specimens during the acute phase of infection. The lowest concentration of SARS-CoV-2 viral copies this assay can detect is 138 copies/mL. A negative result does not preclude SARS-Cov-2 infection and should not be used as the sole basis for treatment or other patient management decisions. A negative result may occur with  improper specimen collection/handling, submission of specimen other than nasopharyngeal swab, presence of viral mutation(s) within the areas targeted by this assay, and inadequate number of viral copies(<138 copies/mL). A negative result must be combined with clinical observations, patient history, and epidemiological information. The expected result is Negative.  Fact Sheet for Patients:  BloggerCourse.com  Fact Sheet for Healthcare Providers:  SeriousBroker.it  This test is no t yet approved or cleared by the [place] FDA and  has been authorized for detection and/or diagnosis of SARS-CoV-2 by FDA under an Emergency Use  Authorization (EUA). This EUA will remain  in effect (meaning this test can be used) for the duration of the COVID-19 declaration under Section 564(b)(1) of the Act, 21 U.S.C.section 360bbb-3(b)(1), unless the authorization is terminated  or revoked sooner.       Influenza A by PCR NEGATIVE NEGATIVE Final   Influenza B by PCR NEGATIVE NEGATIVE Final    Comment: (NOTE) The Xpert Xpress SARS-CoV-2/FLU/RSV plus assay is intended as an aid in the diagnosis of influenza from Nasopharyngeal swab specimens and should not be used as a sole basis for treatment. Nasal washings and aspirates are unacceptable for Xpert Xpress SARS-CoV-2/FLU/RSV testing.  Fact Sheet for Patients: BloggerCourse.com  Fact Sheet for Healthcare Providers: SeriousBroker.it  This test is not yet approved or cleared by the [place] FDA and has been authorized for detection and/or diagnosis of SARS-CoV-2 by FDA under an Emergency Use Authorization (EUA). This EUA will remain in effect (meaning this test can be used) for the duration of the COVID-19 declaration under Section 564(b)(1) of the Act, 21 U.S.C. section 360bbb-3(b)(1), unless the authorization is terminated or revoked.     Resp Syncytial Virus by PCR NEGATIVE NEGATIVE Final  Comment: (NOTE) Fact Sheet for Patients: BloggerCourse.com  Fact Sheet for Healthcare Providers: SeriousBroker.it  This test is not yet approved or cleared by the [place] FDA and has been authorized for detection and/or diagnosis of SARS-CoV-2 by FDA under an Emergency Use Authorization (EUA). This EUA will remain in effect (meaning this test can be used) for the duration of the COVID-19 declaration under Section 564(b)(1) of the Act, 21 U.S.C. section 360bbb-3(b)(1), unless the authorization is terminated or revoked.  Performed at [facility],  [address] [place], [place] [address]   Blood Culture (routine x 2)     Status: None (Preliminary result)   Collection Time: 11/12/22  1:57 PM   Specimen: BLOOD  Result Value Ref Range Status   Specimen Description   Final    BLOOD RIGHT ANTECUBITAL Performed at [facility], [address], [place], [place] [address]    Special Requests   Final    BOTTLES DRAWN AEROBIC AND ANAEROBIC Blood Culture adequate volume Performed at [facility], [address] [place], [place] [address]    Culture   Final    NO GROWTH 2 DAYS Performed at [facility], [address] [place], [place] [address]    Report Status PENDING  Incomplete  Blood Culture (routine x 2)     Status: None (Preliminary result)   Collection Time: 11/12/22  2:06 PM   Specimen: BLOOD  Result Value Ref Range Status   Specimen Description   Final    BLOOD RIGHT ANTECUBITAL Performed at [facility], [address], [place], [place] [address]    Special Requests   Final    BOTTLES DRAWN AEROBIC AND ANAEROBIC Blood Culture adequate volume Performed at [facility], [address] [place], [place] [address]    Culture   Final    NO GROWTH 2 DAYS Performed at [facility], [address] [place], [place] [address]    Report Status PENDING  Incomplete  Gastrointestinal Panel by PCR , Stool     Status: Abnormal   Collection Time: 11/12/22  9:18 PM   Specimen: STOOL  Result Value Ref Range Status   Campylobacter species NOT DETECTED NOT DETECTED Final   Plesimonas shigelloides NOT DETECTED NOT DETECTED Final   Salmonella species NOT DETECTED NOT DETECTED Final   Yersinia enterocolitica NOT DETECTED NOT DETECTED Final   Vibrio species NOT DETECTED NOT DETECTED Final   Vibrio cholerae NOT DETECTED NOT DETECTED Final   Enteroaggregative E coli (EAEC) NOT DETECTED NOT DETECTED Final   Enteropathogenic E coli (EPEC) NOT DETECTED NOT DETECTED Final    Enterotoxigenic E coli (ETEC) NOT DETECTED NOT DETECTED Final   Shiga like toxin producing E coli (STEC) NOT DETECTED NOT DETECTED Final   Shigella/Enteroinvasive E coli (EIEC) DETECTED (A) NOT DETECTED Final    Comment: RESULT CALLED TO, READ BACK BY AND VERIFIED WITH:  Gunnar Fusi 1545 11/13/2022 CP    Cryptosporidium NOT DETECTED NOT DETECTED Final   Cyclospora cayetanensis NOT DETECTED NOT DETECTED Final   Entamoeba histolytica NOT DETECTED NOT DETECTED Final   Giardia lamblia NOT DETECTED NOT DETECTED Final   Adenovirus F40/41 NOT DETECTED NOT DETECTED Final   Astrovirus NOT DETECTED NOT DETECTED Final   Norovirus GI/GII NOT DETECTED NOT DETECTED Final   Rotavirus A NOT DETECTED NOT DETECTED Final   Sapovirus (I, II, IV, and V) NOT DETECTED NOT DETECTED Final  Comment: Performed at [facility], [address] [place], [place] [address]  C Difficile Quick Screen w PCR reflex     Status: None   Collection Time: 11/12/22  9:18 PM   Specimen: STOOL  Result Value Ref Range Status   C Diff antigen NEGATIVE NEGATIVE Final   C Diff toxin NEGATIVE NEGATIVE Final   C Diff interpretation No C. difficile detected.  Final    Comment: Performed at [facility], [address] [place], [place] [address]    Labs: CBC: Recent Labs  Lab 11/12/22 1200 11/13/22 0359 11/14/22 0500  WBC 11.6* 7.8 5.4  HGB 11.5* 10.3* 10.4*  HCT 37.4* 33.8* 34.3*  MCV 88.0 89.2 89.8  PLT 160 140* 120*   Basic Metabolic Panel: Recent Labs  Lab 11/12/22 1200 11/13/22 0359 11/14/22 0500  NA 138 136 136  K 3.7 3.1* 3.0*  CL 103 106 105  CO2 24 22 24   GLUCOSE 104* 112* 95  BUN 10 10 8   CREATININE 0.95 1.13 1.19  CALCIUM 9.1 7.7* 7.9*   Liver Function Tests: Recent Labs  Lab 11/12/22 1200 11/13/22 0359 11/14/22 0500  AST 37 26 32  ALT 23 17 17   ALKPHOS 47 33* 30*  BILITOT 1.1 1.1 0.8  PROT 8.4* 6.5 6.0*  ALBUMIN 4.6 3.3* 3.1*   CBG: No results for  input(s): "GLUCAP" in the last 168 hours.  Discharge time spent: greater than 30 minutes.  Signed: Jonah Blue, MD [facility] 11/14/2022

## 2022-11-17 LAB — CULTURE, BLOOD (ROUTINE X 2)
Culture: NO GROWTH
Culture: NO GROWTH
Special Requests: ADEQUATE
Special Requests: ADEQUATE

## 2022-12-08 LAB — MISCELLANEOUS TEST

## 2024-01-08 ENCOUNTER — Ambulatory Visit: Payer: Self-pay
